# Patient Record
Sex: Female | Born: 1970
Health system: Southern US, Community
[De-identification: ages and names within clinical notes are randomized; demographics above are authoritative.]

## PROBLEM LIST (undated history)

## (undated) DIAGNOSIS — F32A Depression, unspecified: Secondary | ICD-10-CM

## (undated) DIAGNOSIS — E785 Hyperlipidemia, unspecified: Secondary | ICD-10-CM

## (undated) DIAGNOSIS — E119 Type 2 diabetes mellitus without complications: Secondary | ICD-10-CM

## (undated) DIAGNOSIS — F419 Anxiety disorder, unspecified: Secondary | ICD-10-CM

## (undated) DIAGNOSIS — K219 Gastro-esophageal reflux disease without esophagitis: Secondary | ICD-10-CM

## (undated) DIAGNOSIS — D649 Anemia, unspecified: Secondary | ICD-10-CM

## (undated) DIAGNOSIS — G2581 Restless legs syndrome: Secondary | ICD-10-CM

## (undated) DIAGNOSIS — R928 Other abnormal and inconclusive findings on diagnostic imaging of breast: Secondary | ICD-10-CM

## (undated) DIAGNOSIS — R87619 Unspecified abnormal cytological findings in specimens from cervix uteri: Secondary | ICD-10-CM

## (undated) DIAGNOSIS — Z9889 Other specified postprocedural states: Secondary | ICD-10-CM

## (undated) DIAGNOSIS — Z8719 Personal history of other diseases of the digestive system: Secondary | ICD-10-CM

## (undated) HISTORY — DX: Other abnormal and inconclusive findings on diagnostic imaging of breast: R92.8

## (undated) HISTORY — DX: Unspecified abnormal cytological findings in specimens from cervix uteri: R87.619

## (undated) HISTORY — PX: ABDOMINAL HYSTERECTOMY: SHX81

## (undated) HISTORY — DX: Hyperlipidemia, unspecified: E78.5

## (undated) HISTORY — DX: Depression, unspecified: F32.A

## (undated) HISTORY — DX: Anxiety disorder, unspecified: F41.9

## (undated) HISTORY — PX: CHOLECYSTECTOMY: SHX55

---

## 1998-11-28 HISTORY — PX: CHOLECYSTECTOMY: SHX55

## 2015-11-29 HISTORY — PX: UPPER GASTROINTESTINAL ENDOSCOPY: SHX188

## 2015-11-29 HISTORY — PX: COLONOSCOPY: SHX174

## 2017-11-28 HISTORY — PX: ABDOMINAL HYSTERECTOMY: SHX81

## 2020-03-12 DIAGNOSIS — K219 Gastro-esophageal reflux disease without esophagitis: Secondary | ICD-10-CM | POA: Insufficient documentation

## 2020-03-12 DIAGNOSIS — F419 Anxiety disorder, unspecified: Secondary | ICD-10-CM | POA: Insufficient documentation

## 2020-03-12 DIAGNOSIS — E7849 Other hyperlipidemia: Secondary | ICD-10-CM | POA: Insufficient documentation

## 2020-03-12 DIAGNOSIS — Z6838 Body mass index (BMI) 38.0-38.9, adult: Secondary | ICD-10-CM | POA: Insufficient documentation

## 2020-03-12 LAB — HM HEPATITIS C SCREENING LAB: HM Hepatitis Screen: NEGATIVE

## 2020-03-12 LAB — HM HIV SCREENING LAB: HM HIV Screening: NEGATIVE

## 2020-03-13 LAB — HM MAMMOGRAPHY

## 2021-04-22 ENCOUNTER — Ambulatory Visit
Admission: RE | Admit: 2021-04-22 | Discharge: 2021-04-22 | Disposition: A | Discharge status: Home / Self Care | Payer: Self-pay | Source: Ambulatory Visit | Attending: Nurse Practitioner | Admitting: Nurse Practitioner

## 2021-04-22 ENCOUNTER — Other Ambulatory Visit (HOSPITAL_COMMUNITY): Payer: Self-pay | Admitting: Nurse Practitioner

## 2021-04-22 ENCOUNTER — Inpatient Hospital Stay
Admission: RE | Admit: 2021-04-22 | Discharge: 2021-04-22 | Disposition: A | Discharge status: Home / Self Care | Payer: Self-pay | Source: Ambulatory Visit | Attending: Nurse Practitioner | Admitting: Nurse Practitioner

## 2021-04-22 ENCOUNTER — Ambulatory Visit (HOSPITAL_COMMUNITY)
Admission: RE | Admit: 2021-04-22 | Discharge: 2021-04-22 | Disposition: A | Discharge status: Home / Self Care | Payer: 59 | Source: Ambulatory Visit | Attending: Nurse Practitioner | Admitting: Nurse Practitioner

## 2021-04-22 ENCOUNTER — Other Ambulatory Visit: Payer: Self-pay

## 2021-04-22 DIAGNOSIS — Z1231 Encounter for screening mammogram for malignant neoplasm of breast: Secondary | ICD-10-CM

## 2021-05-06 ENCOUNTER — Ambulatory Visit (HOSPITAL_BASED_OUTPATIENT_CLINIC_OR_DEPARTMENT_OTHER): Payer: 59 | Admitting: Nurse Practitioner

## 2021-05-06 ENCOUNTER — Other Ambulatory Visit: Payer: Self-pay

## 2021-05-06 ENCOUNTER — Encounter (HOSPITAL_BASED_OUTPATIENT_CLINIC_OR_DEPARTMENT_OTHER): Payer: Self-pay | Admitting: Nurse Practitioner

## 2021-05-06 VITALS — BP 121/90 | HR 86 | Ht 65.0 in | Wt 228.8 lb

## 2021-05-06 DIAGNOSIS — F419 Anxiety disorder, unspecified: Secondary | ICD-10-CM

## 2021-05-06 DIAGNOSIS — E7849 Other hyperlipidemia: Secondary | ICD-10-CM

## 2021-05-06 DIAGNOSIS — K219 Gastro-esophageal reflux disease without esophagitis: Secondary | ICD-10-CM

## 2021-05-06 DIAGNOSIS — Z6838 Body mass index (BMI) 38.0-38.9, adult: Secondary | ICD-10-CM

## 2021-05-06 DIAGNOSIS — Z0000 Encounter for general adult medical examination without abnormal findings: Secondary | ICD-10-CM | POA: Diagnosis not present

## 2021-05-06 DIAGNOSIS — G2581 Restless legs syndrome: Secondary | ICD-10-CM | POA: Insufficient documentation

## 2021-05-06 DIAGNOSIS — F32A Depression, unspecified: Secondary | ICD-10-CM

## 2021-05-06 DIAGNOSIS — Z7689 Persons encountering health services in other specified circumstances: Secondary | ICD-10-CM | POA: Diagnosis not present

## 2021-05-06 LAB — POCT GLYCOSYLATED HEMOGLOBIN (HGB A1C): HbA1c POC (<> result, manual entry): 5.9 % (ref 4.0–5.6)

## 2021-05-06 MED ORDER — GABAPENTIN 100 MG PO CAPS: ORAL_CAPSULE | ORAL | 6 refills | Status: DC

## 2021-05-06 MED ORDER — BUPROPION HCL 100 MG PO TABS: 100.0000 mg | ORAL_TABLET | Freq: Every day | ORAL | 3 refills | Status: DC

## 2021-05-06 MED ORDER — ESOMEPRAZOLE MAGNESIUM 20 MG PO PACK: 20.0000 mg | PACK | Freq: Every day | ORAL | 3 refills | Status: DC

## 2021-05-06 MED ORDER — ATORVASTATIN CALCIUM 20 MG PO TABS: 20.0000 mg | ORAL_TABLET | Freq: Every day | ORAL | 3 refills | Status: DC

## 2021-05-06 MED ORDER — VENLAFAXINE HCL ER 150 MG PO TB24
1.0000 | ORAL_TABLET | Freq: Every day | ORAL | 3 refills | Status: DC
Start: 2021-05-06 — End: 2021-06-18

## 2021-05-06 NOTE — Patient Instructions (Addendum)
Recommendations from today's visit: I have sent your medications to the [facility] We will let you know if there are any abnormalities on your labs and if we need you to follow-up sooner than expected If you have any concerns, please let us know  Information on diet, exercise, and health maintenance recommendations are listed below. This is information to help you be sure you are on track for optimal health and monitoring.   Please look over this and let us know if you have any questions or if you have completed any of the health maintenance outside of [place] so that we can be sure your records are up to date.  ___________________________________________________________  Thank you for choosing [facility] for your Primary Care needs. I am excited for the opportunity to partner with you to meet your health care goals. It was a pleasure meeting you today!  I am an Adult-Geriatric Nurse Practitioner with a background in caring for patients for more than 20 years. I received my Paediatric nurse in Nursing and my Doctor of Nursing Practice degrees at [employer]. I received additional fellowship training in primary care and sports medicine after receiving my doctorate degree. I provide primary care and sports medicine services to patients age 41 and older within this office. I am also a provider with the [facility] and the director of the APP Fellowship with [facility].  I am a [place] native, but have called the [place] area home for nearly 20 years and am proud to be a member of this community.   I am passionate about providing the best service to you through preventive medicine and supportive care. I consider you a part of the medical team and value your input. I work diligently to ensure that you are heard and your needs are met in a safe and effective manner. I want you to feel comfortable with me as your provider  and want you to know that your health concerns are important to me.   For your information, our office hours are Monday- Friday 8:00 AM - 5:00 PM At this time I am not in the office on Wednesdays.  If you have questions or concerns, please call our office at [phone] or send [place] a MyChart message and we will respond as quickly as possible.   For all urgent or time sensitive needs we ask that you please call the office to avoid delays. MyChart is not constantly monitored and replies may take up to 72 business hours.  MyChart Policy: MyChart allows for you to see your visit notes, after visit summary, provider recommendations, lab and tests results, make an appointment, request refills, and contact your provider or the office for non-urgent questions or concerns.  Providers are seeing patients during normal business hours and do not have built in time to review MyChart messages. We ask that you allow a minimum of 72 business hours for MyChart message responses.  Complex MyChart concerns may require a visit. Your provider may request you schedule a virtual or in person visit to ensure we are providing the best care possible. MyChart messages sent after 4:00 PM on Friday will not be received by the provider until Monday morning.    Lab and Test Results: You will receive your lab and test results on MyChart as soon as they are completed and results have been sent by the lab or testing facility. Due to this service, you will  receive your results BEFORE your provider.  Please allow a minimum of 72 business hours for your provider to receive and review lab and test results and contact you about.   Most lab and test result comments from the provider will be sent through [place]. Your provider may recommend changes to the plan of care, follow-up visits, repeat testing, ask questions, or request an office visit to discuss these results. You may reply directly to this message or call the office at  [phone] to provide information for the provider or set up an appointment. In some instances, you will be called with test results and recommendations. Please let us know if this is preferred and we will make note of this in your chart to provide this for you.    If you have not heard a response to your lab or test results in 72 business hours, please call the office to let us know.   After Hours: For all non-emergency after hours needs, please call the office at [phone] and select the option to reach the on-call provider service. On-call services are shared between multiple [place] offices and therefore it will not be possible to speak directly with your provider. On-call providers may provide medical advice and recommendations, but are unable to provide refills for maintenance medications.  For all emergency or urgent medical needs after normal business hours, we recommend that you seek care at the closest Urgent Care or Emergency Department to ensure appropriate treatment in a timely manner.  [facility] [place] has a 24 hour emergency room located on the ground floor for your convenience.    Please do not hesitate to reach out to [place] with concerns.   Thank you, again, for choosing me as your health care partner. I appreciate your trust and look forward to learning more about you.   Worthy Keeler, DNP, AGNP-c ___________________________________________________________  Health Maintenance Recommendations Screening Testing Mammogram Every 1 -2 years based on history and risk factors Starting at age 58 Pap Smear Ages 21-39 every 3 years Ages 37-65 every 5 years with HPV testing More frequent testing may be required based on results and history Colon Cancer Screening Every 1-10 years based on test performed, risk factors, and history Starting at age 61 Bone Density Screening Every 2-10 years based on history Starting at age 32 for women Recommendations for  men differ based on medication usage, history, and risk factors AAA Screening One time ultrasound Men 37-77 years old who have every smoked Lung Cancer Screening Low Dose Lung CT every 12 months Age 3-80 years with a 30 pack-year smoking history who still smoke or who have quit within the last 15 years  Screening Labs Routine  Labs: Complete Blood Count (CBC), Complete Metabolic Panel (CMP), Cholesterol (Lipid Panel) Every 6-12 months based on history and medications May be recommended more frequently based on current conditions or previous results Hemoglobin A1c Lab Every 3-12 months based on history and previous results Starting at age 68 or earlier with diagnosis of diabetes, high cholesterol, BMI >26, and/or risk factors Frequent monitoring for patients with diabetes to ensure blood sugar control Thyroid Panel (TSH w/ T3 & T4) Every 6 months based on history, symptoms, and risk factors May be repeated more often if on medication HIV One time testing for all patients 57 and older May be repeated more frequently for patients with increased risk factors or exposure Hepatitis C One time testing for all patients 55 and older May be repeated more  frequently for patients with increased risk factors or exposure Gonorrhea, Chlamydia Every 12 months for all sexually active persons 13-24 years Additional monitoring may be recommended for those who are considered high risk or who have symptoms PSA Men 60-54 years old with risk factors Additional screening may be recommended from age 36-69 based on risk factors, symptoms, and history  Vaccine Recommendations Tetanus Booster All adults every 10 years Flu Vaccine All patients 6 months and older every year COVID Vaccine All patients 12 years and older Initial dosing with booster May recommend additional booster based on age and health history HPV Vaccine 2 doses all patients age 32-26 Dosing may be considered for patients over  26 Shingles Vaccine (Shingrix) 2 doses all adults 35 years and older Pneumonia (Pneumovax 23) All adults 35 years and older May recommend earlier dosing based on health history Pneumonia (Prevnar 39) All adults 34 years and older Dosed 1 year after Pneumovax 23  Additional Screening, Testing, and Vaccinations may be recommended on an individualized basis based on family history, health history, risk factors, and/or exposure.  __________________________________________________________  Diet Recommendations for All Patients  I recommend that all patients maintain a diet low in saturated fats, carbohydrates, and cholesterol. While this can be challenging at first, it is not impossible and small changes can make big differences.  Things to try: Decreasing the amount of soda, sweet tea, and/or juice to one or less per day and replace with water While water is always the first choice, if you do not like water you may consider adding a water additive without sugar to improve the taste other sugar free drinks Replace potatoes with a brightly colored vegetable at dinner Use healthy oils, such as canola oil or olive oil, instead of butter or hard margarine Limit your bread intake to two pieces or less a day Replace regular pasta with low carb pasta options Bake, broil, or grill foods instead of frying Monitor portion sizes  Eat smaller, more frequent meals throughout the day instead of large meals  An important thing to remember is, if you love foods that are not great for your health, you don't have to give them up completely. Instead, allow these foods to be a reward when you have done well. Allowing yourself to still have special treats every once in a while is a nice way to tell yourself thank you for working hard to keep yourself healthy.   Also remember that every day is a new day. If you have a bad day and "fall off the wagon", you can still climb right back up and keep moving along on  your journey!  We have resources available to help you!  Some websites that may be helpful include: www.http://carter.biz/  Www.VeryWellFit.com _____________________________________________________________  Activity Recommendations for All Patients  I recommend that all adults get at least 20 minutes of moderate physical activity that elevates your heart rate at least 5 days out of the week.  Some examples include: Walking or jogging at a pace that allows you to carry on a conversation Cycling (stationary bike or outdoors) Water aerobics Yoga Weight lifting Dancing If physical limitations prevent you from putting stress on your joints, exercise in a pool or seated in a chair are excellent options.  Do determine your MAXIMUM heart rate for activity: YOUR AGE - 220 = MAX HeartRate   Remember! Do not push yourself too hard.  Start slowly and build up your pace, speed, weight, time in exercise, etc.  Allow your  body to rest between exercise and get good sleep. You will need more water than normal when you are exerting yourself. Do not wait until you are thirsty to drink. Drink with a purpose of getting in at least 8, 8 ounce glasses of water a day plus more depending on how much you exercise and sweat.    If you begin to develop dizziness, chest pain, abdominal pain, jaw pain, shortness of breath, headache, vision changes, lightheadedness, or other concerning symptoms, stop the activity and allow your body to rest. If your symptoms are severe, seek emergency evaluation immediately. If your symptoms are concerning, but not severe, please let us know so that we can recommend further evaluation.   ________________________________________________________________

## 2021-05-06 NOTE — Progress Notes (Signed)
Worthy Keeler, DNP, AGNP-c Primary Care Services ______________________________________________________________________________________________________________________________________________  HPI Anne Kramer is a 50 y.o. female presenting to [facility] today to establish care.   Patient Care Team: Jarquis Walker, Coralee Pesa, NP as PCP - General (Nurse Practitioner)   Concerns today: Difficulty Sleeping/Staying Asleep Endorses difficulty falling asleep and staying asleep Legs restless and keep her awake Has tried many OTC and Rx medications for sleep in the past that have not been effective Good bedtime routine, no late night meals, no distractions, no late night caffeine intake Generalized symptoms: Night sweats, fatigue, joint pain, vision changes, easy bruising, increased thirst, weight gain She has been monitoring her diet well and exercising but has had 30lb weight gain in last couple of years Eats small portions at meals No N/V/D Hysterectomy with cervix removal- likely post-menopausal   PHQ9 Today: Depression screen PHQ 2/9 05/06/2021  Decreased Interest 1  Down, Depressed, Hopeless 2  PHQ - 2 Score 3  Altered sleeping 3  Tired, decreased energy 3  Change in appetite 3  Feeling bad or failure about yourself  2  Trouble concentrating 1  Moving slowly or fidgety/restless 0  Suicidal thoughts 1  PHQ-9 Score 16  Difficult doing work/chores Somewhat difficult   GAD7 Today: GAD 7 : Generalized Anxiety Score 05/06/2021  Nervous, Anxious, on Edge 2  Control/stop worrying 2  Worry too much - different things 1  Trouble relaxing 1  Restless 1  Easily annoyed or irritable 1  Afraid - awful might happen 0  Total GAD 7 Score 8  Anxiety Difficulty Somewhat difficult    Health Maintenance Due  Topic Date Due   HIV Screening  Never done   Hepatitis C Screening  Never done   PAP SMEAR-Modifier  Never done   COLONOSCOPY  (Pts 45-79yrs Insurance coverage will need to be confirmed)  Never done   Zoster Vaccines- Shingrix (1 of 2) Never done   COVID-19 Vaccine (3 - Booster for Pfizer series) 12/02/2020     PMH Past Medical History:  Diagnosis Date   Abnormal mammogram    Abnormal Pap smear of cervix    Anxiety    Depression    Hyperlipidemia     ROS All review of systems negative except what is listed in the HPI  PHYSICAL EXAM Physical Exam Vitals and nursing note reviewed.  Constitutional:      Appearance: Normal appearance. She is obese.  HENT:     Head: Normocephalic.     Right Ear: Tympanic membrane, ear canal and external ear normal.     Left Ear: Tympanic membrane, ear canal and external ear normal.     Nose: Nose normal.     Mouth/Throat:     Mouth: Mucous membranes are moist.     Pharynx: Oropharynx is clear.  Eyes:     Extraocular Movements: Extraocular movements intact.     Conjunctiva/sclera: Conjunctivae normal.     Pupils: Pupils are equal, round, and reactive to light.  Neck:     Comments: Thyromegaly present Cardiovascular:     Rate and Rhythm: Normal rate and regular rhythm.     Pulses: Normal pulses.     Heart sounds: Normal heart sounds.  Pulmonary:     Effort: Pulmonary effort is normal.     Breath sounds: Normal breath sounds.  Abdominal:     General: Abdomen is flat. Bowel sounds are normal. There is no distension.     Palpations: Abdomen  is soft.     Tenderness: There is no abdominal tenderness. There is no right CVA tenderness, left CVA tenderness, guarding or rebound.  Musculoskeletal:        General: Normal range of motion.     Cervical back: Normal range of motion. No tenderness.     Right lower leg: No edema.     Left lower leg: No edema.  Lymphadenopathy:     Cervical: No cervical adenopathy.  Skin:    General: Skin is warm and dry.     Capillary Refill: Capillary refill takes less than 2 seconds.  Neurological:     General: No focal deficit  present.     Mental Status: She is alert and oriented to person, place, and time.  Psychiatric:        Mood and Affect: Mood normal.        Behavior: Behavior normal.        Thought Content: Thought content normal.        Judgment: Judgment normal.      ASSESSMENT AND PLAN Problem List Items Addressed This Visit     Anxiety and depression - Primary   Relevant Medications   buPROPion (WELLBUTRIN) 100 MG tablet   Venlafaxine HCl 150 MG TB24   Other Relevant Orders   TSH   BMI 38.0-38.9,adult   Relevant Medications   atorvastatin (LIPITOR) 20 MG tablet   Other Relevant Orders   Comprehensive metabolic panel   Lipid panel   TSH   POCT glycosylated hemoglobin (Hb A1C) (Completed)   CBC with Differential   Gastroesophageal reflux disease without esophagitis   Relevant Medications   esomeprazole (NEXIUM) 20 MG packet   Other Relevant Orders   CBC with Differential   Other hyperlipidemia   Relevant Medications   atorvastatin (LIPITOR) 20 MG tablet   Other Relevant Orders   Comprehensive metabolic panel   Lipid panel   POCT glycosylated hemoglobin (Hb A1C) (Completed)   Restless leg syndrome   Relevant Medications   gabapentin (NEURONTIN) 100 MG capsule   Other Relevant Orders   TSH   Other Visit Diagnoses     Encounter to establish care       Encounter for physical examination       Laboratory tests ordered as part of a complete physical exam (CPE)       Relevant Orders   Comprehensive metabolic panel   Lipid panel   TSH   POCT glycosylated hemoglobin (Hb A1C) (Completed)   CBC with Differential      PLAN: Labs today for evaluation of diabetes and thyroid disorder given some of her symptoms present.  Nighttime symptoms appear to be due to restless leg syndrome. Will try gabapentin 100-300mg  at bedtime to see if this is helpful in reducing symptoms and permitting restful sleep. Recommend F/U if this is not effective No alarm symptoms of PE today.   Education  provided today during visit and on AVS for patient to review at home.  Diet and Exercise recommendations provided.  Current diagnoses and recommendations discussed. HM recommendations reviewed with recommendations.    Outpatient Encounter Medications as of 05/06/2021  Medication Sig   gabapentin (NEURONTIN) 100 MG capsule Take 1 capsule (100mg ) twice during the day, 8 hours apart as needed. Take 1-3 capsules (100mg  - 300mg ) at night for restless legs and anxiety   [DISCONTINUED] atorvastatin (LIPITOR) 20 MG tablet TAKE 1 TABLET(20 MG) BY MOUTH DAILY   [DISCONTINUED] atorvastatin (LIPITOR) 20 MG tablet Take 20  mg by mouth daily.   [DISCONTINUED] buPROPion (WELLBUTRIN) 100 MG tablet Take by mouth.   [DISCONTINUED] cyclobenzaprine (FLEXERIL) 10 MG tablet Take 1 tablet by mouth 3 (three) times daily.   [DISCONTINUED] esomeprazole (NEXIUM) 20 MG packet Take by mouth.   [DISCONTINUED] predniSONE (STERAPRED UNI-PAK 48 TAB) 5 MG (48) TBPK tablet Take by mouth as directed.   [DISCONTINUED] Venlafaxine HCl 150 MG TB24 Take 1 tablet by mouth daily.   atorvastatin (LIPITOR) 20 MG tablet Take 1 tablet (20 mg total) by mouth daily.   buPROPion (WELLBUTRIN) 100 MG tablet Take 1 tablet (100 mg total) by mouth daily.   esomeprazole (NEXIUM) 20 MG packet Take 20 mg by mouth daily before breakfast.   Venlafaxine HCl 150 MG TB24 Take 1 tablet (150 mg total) by mouth daily.   No facility-administered encounter medications on file as of 05/06/2021.    Return in about 1 year (around 05/06/2022) for CPE.  Time: 57minutes, >50% spent counseling, care coordination, chart review, and documentation.   Orma Render, DNP, AGNP-c

## 2021-05-07 LAB — CBC WITH DIFFERENTIAL/PLATELET
Basophils Absolute: 0.1 10*3/uL (ref 0.0–0.2)
Basos: 1 %
EOS (ABSOLUTE): 0.3 10*3/uL (ref 0.0–0.4)
Eos: 4 %
Hematocrit: 41.3 % (ref 34.0–46.6)
Hemoglobin: 13.8 g/dL (ref 11.1–15.9)
Immature Grans (Abs): 0 10*3/uL (ref 0.0–0.1)
Immature Granulocytes: 0 %
Lymphocytes Absolute: 1.8 10*3/uL (ref 0.7–3.1)
Lymphs: 28 %
MCH: 28.6 pg (ref 26.6–33.0)
MCHC: 33.4 g/dL (ref 31.5–35.7)
MCV: 86 fL (ref 79–97)
Monocytes Absolute: 0.4 10*3/uL (ref 0.1–0.9)
Monocytes: 6 %
Neutrophils Absolute: 4 10*3/uL (ref 1.4–7.0)
Neutrophils: 61 %
Platelets: 351 10*3/uL (ref 150–450)
RBC: 4.83 x10E6/uL (ref 3.77–5.28)
RDW: 13 % (ref 11.7–15.4)
WBC: 6.6 10*3/uL (ref 3.4–10.8)

## 2021-05-07 LAB — COMPREHENSIVE METABOLIC PANEL
ALT: 27 IU/L (ref 0–32)
AST: 22 IU/L (ref 0–40)
Albumin/Globulin Ratio: 1.7 (ref 1.2–2.2)
Albumin: 4.7 g/dL (ref 3.8–4.8)
Alkaline Phosphatase: 139 IU/L — ABNORMAL HIGH (ref 44–121)
BUN/Creatinine Ratio: 13 (ref 9–23)
BUN: 13 mg/dL (ref 6–24)
Bilirubin Total: 0.6 mg/dL (ref 0.0–1.2)
CO2: 20 mmol/L (ref 20–29)
Calcium: 10.3 mg/dL — ABNORMAL HIGH (ref 8.7–10.2)
Chloride: 103 mmol/L (ref 96–106)
Creatinine, Ser: 0.97 mg/dL (ref 0.57–1.00)
Globulin, Total: 2.8 g/dL (ref 1.5–4.5)
Glucose: 98 mg/dL (ref 65–99)
Potassium: 4.4 mmol/L (ref 3.5–5.2)
Sodium: 138 mmol/L (ref 134–144)
Total Protein: 7.5 g/dL (ref 6.0–8.5)
eGFR: 71 mL/min/{1.73_m2} (ref >59)

## 2021-05-07 LAB — LIPID PANEL
Chol/HDL Ratio: 5 ratio — ABNORMAL HIGH (ref 0.0–4.4)
Cholesterol, Total: 280 mg/dL — ABNORMAL HIGH (ref 100–199)
HDL: 56 mg/dL (ref >39)
LDL Chol Calc (NIH): 203 mg/dL — ABNORMAL HIGH (ref 0–99)
Triglycerides: 118 mg/dL (ref 0–149)
VLDL Cholesterol Cal: 21 mg/dL (ref 5–40)

## 2021-05-07 LAB — TSH: TSH: 0.608 u[IU]/mL (ref 0.450–4.500)

## 2021-05-10 NOTE — Progress Notes (Signed)
Please call patient.  A1c indicates pre-diabetes. Recommend start on metformin- may also consider possible injectable medication to help with blood sugar and weight loss depending on insurance. If interested, please schedule f/u visit to discuss. May also work on diet and exercise to help reduce A1c and f/u in 6 months for recheck.   Cholesterol is elevated. Work on diet and exercise and we will plan to recheck in 6 months to determine if we need to make changes.  Recommend foods low in saturated fats- avoid oils/fats that are solid at room temperature and exchange for vegetable oils or Olive oil.  Recommend walking 20 minutes every day.   All other labs normal.

## 2021-05-12 ENCOUNTER — Telehealth (HOSPITAL_BASED_OUTPATIENT_CLINIC_OR_DEPARTMENT_OTHER): Payer: Self-pay

## 2021-05-12 NOTE — Telephone Encounter (Signed)
Left message for patient to call back for results and recommendations. 

## 2021-05-12 NOTE — Telephone Encounter (Signed)
Patient called to discuss lab result and recommendations.  She is aware and agrees.  Appointments for follow up scheduled on 05/25/21 @ 8:50a am and 11/11/21 @ 8:50.  Instructed patient to contact the office with questions and concerns

## 2021-05-12 NOTE — Telephone Encounter (Signed)
-----   Message from Orma Render, NP sent at 05/10/2021  8:31 AM EDT ----- Please call patient.  A1c indicates pre-diabetes. Recommend start on metformin- may also consider possible injectable medication to help with blood sugar and weight loss depending on insurance. If interested, please schedule f/u visit to discuss. May also work on diet and exercise to help reduce A1c and f/u in 6 months for recheck.   Cholesterol is elevated. Work on diet and exercise and we will plan to recheck in 6 months to determine if we need to make changes.  Recommend foods low in saturated fats- avoid oils/fats that are solid at room temperature and exchange for vegetable oils or Olive oil.  Recommend walking 20 minutes every day.   All other labs normal.

## 2021-05-19 ENCOUNTER — Encounter (HOSPITAL_BASED_OUTPATIENT_CLINIC_OR_DEPARTMENT_OTHER): Payer: Self-pay | Admitting: Nurse Practitioner

## 2021-05-25 ENCOUNTER — Ambulatory Visit (HOSPITAL_BASED_OUTPATIENT_CLINIC_OR_DEPARTMENT_OTHER): Payer: 59 | Admitting: Nurse Practitioner

## 2021-05-25 ENCOUNTER — Other Ambulatory Visit: Payer: Self-pay

## 2021-05-25 ENCOUNTER — Encounter (HOSPITAL_BASED_OUTPATIENT_CLINIC_OR_DEPARTMENT_OTHER): Payer: Self-pay | Admitting: Nurse Practitioner

## 2021-05-25 VITALS — BP 127/86 | HR 89 | Ht 65.0 in | Wt 227.8 lb

## 2021-05-25 DIAGNOSIS — Z6838 Body mass index (BMI) 38.0-38.9, adult: Secondary | ICD-10-CM | POA: Diagnosis not present

## 2021-05-25 DIAGNOSIS — R7303 Prediabetes: Secondary | ICD-10-CM | POA: Diagnosis not present

## 2021-05-25 DIAGNOSIS — E7849 Other hyperlipidemia: Secondary | ICD-10-CM | POA: Diagnosis not present

## 2021-05-25 MED ORDER — METFORMIN HCL 500 MG PO TABS: 500.0000 mg | ORAL_TABLET | Freq: Two times a day (BID) | ORAL | 3 refills | Status: DC

## 2021-05-25 MED ORDER — PHENTERMINE HCL 37.5 MG PO CAPS: 37.5000 mg | ORAL_CAPSULE | ORAL | 0 refills | Status: DC

## 2021-05-25 MED ORDER — PHENTERMINE HCL 37.5 MG PO CAPS: ORAL_CAPSULE | ORAL | 0 refills | Status: DC

## 2021-05-25 MED ORDER — BLOOD GLUCOSE METER KIT: PACK | 0 refills | Status: DC

## 2021-05-25 NOTE — Assessment & Plan Note (Signed)
Most recent lipid levels elevated. Taking artorvastatin 20mg  daily Strong feeling familial HLD present in addition to diet  Recommend strict dietary monitoring and decrease saturated fats and carbohydrates. Increase fiber and water intake Walking minimum 15 min per day - purposeful Will recheck in 6 months- if no improvement, plan to increase statin dosage

## 2021-05-25 NOTE — Assessment & Plan Note (Signed)
A1c 5.9% In the setting of obesity and HLD, want to get tight control of this Anne Kramer before additional problems develop.  BP at goal Lipids not at goal- atorvastatin 20mg  and strict diet and exercise guidelines discussed Recommend starting metformin 500mg  BID with meals Increase dietary fiber and water intake Caloric intake 1200-1500 calories per day with no more than 200g of carbohydrates. Monitor blood sugar fasting at least three days a week- Goal 70-99 Monitor post-prandial blood sugar (1-2 hours after meal) to determine effects of foods - Goal less than 135 (for weight loss benefit) Walk at least 57min per day every day and increase each week.  F/U in 6 months for labs

## 2021-05-25 NOTE — Patient Instructions (Addendum)
PLAN:  Diet changes:  Increase fiber Increase water Reduce saturated fats and fried foods No more than 1500 calories per day No more than 150-200 grams of carbohydrates per day  Exercise: Walk 15 minutes every day  Monitoring: Check your blood sugar at least 3 mornings per week when you wake up (fasting) Check your blood sugar as needed 1-2 hours after meals to see how the foods affected your blood sugar  Medications: Metformin 500mg  in the morning with breakfast and 500mg  in the evening with dinner.  Vitamin D3 800iU per day Continue Lipitor at the same dose  Recheck Plan to come back in 6 months for labs

## 2021-05-25 NOTE — Progress Notes (Signed)
Established Patient Office Visit  Subjective:  Patient ID: Anne Kramer, female    DOB: 1971-08-15  Age: 50 y.o. MRN: [id number]  CC:  Chief Complaint  Patient presents with   Follow-up    HPI Anne Kramer presents for follow-up for new diagnosis of pre-diabetes.  Most recent Hgb A1c was 5.9%. She also has a history of HLD- most likely familial given significant prevalence in all 1st degree relatives despite lack of co-morbidities or lifestyle factors in some. Has a history of elevated BMI. History of Vitamin D deficiency.   She has not been on any medications for diabetes in the past. Currently on atorvastatin 53m per day. No HTN Diet is "not where it should be"  No routine exercise at the current time, but had been working out in the past.   Endorses weight gain with difficulty losing weight, increased thirst, fatigue, constipation, and joint pain. Denies chest pain, palpitations, slow healing wounds, numbness or tingling in extremities, changes in vision.   Past Medical History:  Diagnosis Date   Abnormal mammogram    Abnormal Pap smear of cervix    Anxiety    Depression    Hyperlipidemia     Past Surgical History:  Procedure Laterality Date   ABDOMINAL HYSTERECTOMY     CHOLECYSTECTOMY      Family History  Problem Relation Age of Onset   Breast cancer Maternal Aunt     Social History   Socioeconomic History   Marital status: Married    Spouse name: Not on file   Number of children: Not on file   Years of education: Not on file   Highest education level: Not on file  Occupational History   Not on file  Tobacco Use   Smoking status: Never   Smokeless tobacco: Never  Vaping Use   Vaping Use: Never used  Substance and Sexual Activity   Alcohol use: Not Currently   Drug use: Never   Sexual activity: Yes    Birth control/protection: Surgical  Other Topics Concern   Not on file  Social History Narrative   Not on file   Social  Determinants of Health   Financial Resource Strain: Not on file  Food Insecurity: Not on file  Transportation Needs: Not on file  Physical Activity: Not on file  Stress: Not on file  Social Connections: Not on file  Intimate Partner Violence: Not on file    Outpatient Medications Prior to Visit  Medication Sig Dispense Refill   atorvastatin (LIPITOR) 20 MG tablet Take 1 tablet (20 mg total) by mouth daily. 90 tablet 3   buPROPion (WELLBUTRIN) 100 MG tablet Take 1 tablet (100 mg total) by mouth daily. 90 tablet 3   esomeprazole (NEXIUM) 20 MG packet Take 20 mg by mouth daily before breakfast. 90 each 3   gabapentin (NEURONTIN) 100 MG capsule Take 1 capsule (1067m twice during the day, 8 hours apart as needed. Take 1-3 capsules (10050m 300m76mt night for restless legs and anxiety 150 capsule 6   Venlafaxine HCl 150 MG TB24 Take 1 tablet (150 mg total) by mouth daily. 90 tablet 3   No facility-administered medications prior to visit.    Not on File  ROS Review of Systems All review of systems negative except what is listed in the HPI   Objective:    Physical Exam Vitals and nursing note reviewed.  Constitutional:      Appearance: Normal appearance. She is obese.  HENT:  Head: Normocephalic.  Eyes:     Extraocular Movements: Extraocular movements intact.     Conjunctiva/sclera: Conjunctivae normal.     Pupils: Pupils are equal, round, and reactive to light.  Neck:     Vascular: No carotid bruit.  Cardiovascular:     Rate and Rhythm: Normal rate and regular rhythm.     Pulses: Normal pulses.     Heart sounds: Normal heart sounds.  Pulmonary:     Effort: Pulmonary effort is normal.     Breath sounds: Normal breath sounds.  Abdominal:     General: Abdomen is flat. Bowel sounds are normal.     Palpations: Abdomen is soft.  Musculoskeletal:        General: Normal range of motion.     Cervical back: Normal range of motion and neck supple.     Right lower leg: No  edema.     Left lower leg: No edema.  Skin:    General: Skin is warm and dry.     Capillary Refill: Capillary refill takes less than 2 seconds.  Neurological:     General: No focal deficit present.     Mental Status: She is alert and oriented to person, place, and time.  Psychiatric:        Mood and Affect: Mood normal.        Behavior: Behavior normal.        Thought Content: Thought content normal.        Judgment: Judgment normal.    BP 127/86   Pulse 89   Ht 5' 5"  (1.651 m)   Wt 227 lb 12.8 oz (103.3 kg)   SpO2 100%   BMI 37.91 kg/m  Wt Readings from Last 3 Encounters:  05/25/21 227 lb 12.8 oz (103.3 kg)  05/06/21 228 lb 12.8 oz (103.8 kg)     Health Maintenance Due  Topic Date Due   PAP SMEAR-Modifier  Never done   COLONOSCOPY (Pts 45-30yr Insurance coverage will need to be confirmed)  Never done   Zoster Vaccines- Shingrix (1 of 2) Never done   COVID-19 Vaccine (3 - Booster for Pfizer series) 12/02/2020    There are no preventive care reminders to display for this patient.  Lab Results  Component Value Date   TSH 0.608 05/06/2021   Lab Results  Component Value Date   WBC 6.6 05/06/2021   HGB 13.8 05/06/2021   HCT 41.3 05/06/2021   MCV 86 05/06/2021   PLT 351 05/06/2021   Lab Results  Component Value Date   NA 138 05/06/2021   K 4.4 05/06/2021   CO2 20 05/06/2021   GLUCOSE 98 05/06/2021   BUN 13 05/06/2021   CREATININE 0.97 05/06/2021   BILITOT 0.6 05/06/2021   ALKPHOS 139 (H) 05/06/2021   AST 22 05/06/2021   ALT 27 05/06/2021   PROT 7.5 05/06/2021   ALBUMIN 4.7 05/06/2021   CALCIUM 10.3 (H) 05/06/2021   EGFR 71 05/06/2021   Lab Results  Component Value Date   CHOL 280 (H) 05/06/2021   Lab Results  Component Value Date   HDL 56 05/06/2021   Lab Results  Component Value Date   LDLCALC 203 (H) 05/06/2021   Lab Results  Component Value Date   TRIG 118 05/06/2021   Lab Results  Component Value Date   CHOLHDL 5.0 (H) 05/06/2021    Lab Results  Component Value Date   HGBA1C 5.9 05/06/2021      Assessment & Plan:  Problem List Items Addressed This Visit     Pre-diabetes - Primary   Relevant Medications   metFORMIN (GLUCOPHAGE) 500 MG tablet   blood glucose meter kit and supplies   BMI 38.0-38.9,adult   Relevant Medications   metFORMIN (GLUCOPHAGE) 500 MG tablet   blood glucose meter kit and supplies   phentermine 37.5 MG capsule (Start on 07/23/2021)   phentermine 37.5 MG capsule   phentermine 37.5 MG capsule   Other hyperlipidemia   Relevant Medications   metFORMIN (GLUCOPHAGE) 500 MG tablet   blood glucose meter kit and supplies    Meds ordered this encounter  Medications   metFORMIN (GLUCOPHAGE) 500 MG tablet    Sig: Take 1 tablet (500 mg total) by mouth 2 (two) times daily with a meal.    Dispense:  180 tablet    Refill:  3   blood glucose meter kit and supplies    Sig: Dispense based on patient and insurance preference. Use up to four times daily as directed. (FOR ICD-10 R73.03, Z68.38)    Dispense:  1 each    Refill:  0    Order Specific Question:   Number of strips    Answer:   100    Order Specific Question:   Number of lancets    Answer:   100   phentermine 37.5 MG capsule    Sig: Take 1 capsule (37.5 mg total) by mouth every morning. Script 3 of 3    Dispense:  30 capsule    Refill:  0   phentermine 37.5 MG capsule    Sig: One capsule by mouth qAM. Script 2 of 3    Dispense:  30 capsule    Refill:  0   phentermine 37.5 MG capsule    Sig: One capsule by mouth qAM- script 1 of 3    Dispense:  30 capsule    Refill:  0    Follow-up: Return in about 6 months (around 11/24/2021) for 3 months virtual visit- weight and 6 months in person DM/HLD.    Orma Render, NP

## 2021-05-25 NOTE — Assessment & Plan Note (Signed)
BMI 37.91 today Recommend 1200-1500 calorie diet with no more than 50% of calories from carbohydrates. (150-200 grams carbohydrates per day) Goal post-prandial (1-2) hour blood sugars no more than 135 for weight loss.  Decrease saturated fats and carbohydrates, increase protein and fiber Increase water intake Minimum 15 minutes walking per day- purposeful. Starting phentermine 37.5mg  for three months to boost metabolism. F/U with VV in 3 months for weight management.

## 2021-06-01 ENCOUNTER — Encounter (HOSPITAL_BASED_OUTPATIENT_CLINIC_OR_DEPARTMENT_OTHER): Payer: Self-pay | Admitting: Nurse Practitioner

## 2021-06-09 ENCOUNTER — Other Ambulatory Visit (HOSPITAL_COMMUNITY): Payer: Self-pay

## 2021-06-09 MED ORDER — ESOMEPRAZOLE MAGNESIUM 20 MG PO PACK
20.0000 mg | PACK | ORAL | 3 refills | Status: DC
  Filled 2021-06-09: qty 90, 90d supply, fill #0

## 2021-06-09 MED ORDER — GABAPENTIN 100 MG PO CAPS
100.0000 mg | ORAL_CAPSULE | Freq: Two times a day (BID) | ORAL | 5 refills | Status: DC
Start: 2021-05-06 — End: 2022-05-12
  Filled 2021-06-09: qty 150, 30d supply, fill #0
  Filled 2021-09-02: qty 150, 30d supply, fill #1
  Filled 2021-11-02: qty 150, 30d supply, fill #2
  Filled 2022-01-12: qty 150, 30d supply, fill #3
  Filled 2022-02-23: qty 150, 30d supply, fill #4

## 2021-06-09 MED ORDER — ATORVASTATIN CALCIUM 20 MG PO TABS
20.0000 mg | ORAL_TABLET | Freq: Every day | ORAL | 0 refills | Status: DC
  Filled 2021-06-09: qty 90, 90d supply, fill #0
  Filled 2021-09-13: qty 90, 90d supply, fill #1

## 2021-06-09 MED ORDER — VENLAFAXINE HCL ER 150 MG PO TB24
150.0000 mg | ORAL_TABLET | Freq: Every day | ORAL | 3 refills | Status: DC
  Filled 2021-06-09: qty 90, 90d supply, fill #0

## 2021-06-09 MED ORDER — BUPROPION HCL 100 MG PO TABS
100.0000 mg | ORAL_TABLET | Freq: Every day | ORAL | 0 refills | Status: DC
  Filled 2021-06-09: qty 90, 90d supply, fill #0
  Filled 2022-02-23: qty 90, 90d supply, fill #1

## 2021-06-11 ENCOUNTER — Other Ambulatory Visit (HOSPITAL_COMMUNITY): Payer: Self-pay

## 2021-06-14 ENCOUNTER — Other Ambulatory Visit (HOSPITAL_COMMUNITY): Payer: Self-pay

## 2021-06-15 ENCOUNTER — Other Ambulatory Visit (HOSPITAL_BASED_OUTPATIENT_CLINIC_OR_DEPARTMENT_OTHER): Payer: Self-pay | Admitting: Nurse Practitioner

## 2021-06-15 ENCOUNTER — Other Ambulatory Visit (HOSPITAL_COMMUNITY): Payer: Self-pay

## 2021-06-16 ENCOUNTER — Other Ambulatory Visit (HOSPITAL_COMMUNITY): Payer: Self-pay

## 2021-06-17 ENCOUNTER — Encounter (HOSPITAL_BASED_OUTPATIENT_CLINIC_OR_DEPARTMENT_OTHER): Payer: Self-pay | Admitting: Nurse Practitioner

## 2021-06-18 ENCOUNTER — Other Ambulatory Visit (HOSPITAL_COMMUNITY): Payer: Self-pay

## 2021-06-18 ENCOUNTER — Other Ambulatory Visit (HOSPITAL_BASED_OUTPATIENT_CLINIC_OR_DEPARTMENT_OTHER): Payer: Self-pay | Admitting: Nurse Practitioner

## 2021-06-18 MED ORDER — ESOMEPRAZOLE MAGNESIUM 20 MG PO CPDR
20.0000 mg | DELAYED_RELEASE_CAPSULE | Freq: Every day | ORAL | 3 refills | Status: DC
  Filled 2021-06-18: qty 90, 90d supply, fill #0
  Filled 2021-08-10: qty 90, 90d supply, fill #1

## 2021-06-18 MED ORDER — VENLAFAXINE HCL ER 150 MG PO CP24
150.0000 mg | ORAL_CAPSULE | Freq: Every day | ORAL | 3 refills | Status: DC
  Filled 2021-06-18: qty 90, 90d supply, fill #0
  Filled 2021-09-19: qty 90, 90d supply, fill #1
  Filled 2021-12-21: qty 90, 90d supply, fill #2
  Filled 2022-03-24: qty 90, 90d supply, fill #3

## 2021-08-03 ENCOUNTER — Other Ambulatory Visit: Payer: Self-pay

## 2021-08-03 ENCOUNTER — Encounter: Payer: Self-pay | Admitting: Orthopaedic Surgery

## 2021-08-03 ENCOUNTER — Ambulatory Visit: Payer: 59 | Admitting: Orthopaedic Surgery

## 2021-08-03 ENCOUNTER — Ambulatory Visit
Admission: EM | Admit: 2021-08-03 | Discharge: 2021-08-03 | Disposition: A | Discharge status: Home / Self Care | Payer: 59 | Attending: Family Medicine | Admitting: Family Medicine

## 2021-08-03 ENCOUNTER — Encounter: Payer: Self-pay | Admitting: Emergency Medicine

## 2021-08-03 VITALS — BP 136/92 | HR 99 | Ht 65.0 in | Wt 220.0 lb

## 2021-08-03 DIAGNOSIS — M7711 Lateral epicondylitis, right elbow: Secondary | ICD-10-CM

## 2021-08-03 DIAGNOSIS — J029 Acute pharyngitis, unspecified: Secondary | ICD-10-CM | POA: Diagnosis not present

## 2021-08-03 HISTORY — DX: Type 2 diabetes mellitus without complications: E11.9

## 2021-08-03 NOTE — ED Provider Notes (Signed)
  [place]   [id number] 08/03/21 Arrival Time: [id number]  ASSESSMENT & PLAN:  1. Sore throat    Discussed typical duration of viral illnesses. OTC symptom care as needed.    Discharge Instructions      You may use over the counter ibuprofen or acetaminophen as needed.  For a sore throat, over the counter products such as [employer] Peroxyl Mouth Sore Rinse or Chloraseptic Sore Throat Spray may provide some temporary relief.      Follow-up Information     Early, Coralee Pesa, NP.   Specialty: Nurse Practitioner Why: As needed. Contact information: [address] [place] [address] [phone]                 Reviewed expectations re: course of current medical issues. Questions answered. Outlined signs and symptoms indicating need for more acute intervention. Understanding verbalized. After Visit Summary given.   SUBJECTIVE: History from: patient. Anne Kramer is a 50 y.o. female who reports ST; x 5 days; neg COVID and rapid strep 5 d ago. Denies: fever, cough, and difficulty breathing. Normal PO intake without n/v/d.   OBJECTIVE:  Vitals:   08/03/21 0949  BP: 133/86  Pulse: 86  Resp: 18  Temp: 99.3 F (37.4 C)  TempSrc: Oral  SpO2: 95%    General appearance: alert; no distress Eyes: PERRLA; EOMI; conjunctiva normal HENT: [place]; AT; without nasal congestion; throat raw and irritated; no exudates Neck: supple s LAD Lungs: speaks full sentences without difficulty; unlabored Extremities: no edema Skin: warm and dry Neurologic: normal gait Psychological: alert and cooperative; normal mood and affect   No Known Allergies  Past Medical History:  Diagnosis Date   Abnormal mammogram    Abnormal Pap smear of cervix    Anxiety    Depression    Diabetes mellitus without complication (HCC)    Hyperlipidemia    Social History   Socioeconomic History   Marital status: Married    Spouse name: Not on file   Number of  children: Not on file   Years of education: Not on file   Highest education level: Not on file  Occupational History   Not on file  Tobacco Use   Smoking status: Never   Smokeless tobacco: Never  Vaping Use   Vaping Use: Never used  Substance and Sexual Activity   Alcohol use: Not Currently   Drug use: Never   Sexual activity: Yes    Birth control/protection: Surgical  Other Topics Concern   Not on file  Social History Narrative   Not on file   Social Determinants of Health   Financial Resource Strain: Not on file  Food Insecurity: Not on file  Transportation Needs: Not on file  Physical Activity: Not on file  Stress: Not on file  Social Connections: Not on file  Intimate Partner Violence: Not on file   Family History  Problem Relation Age of Onset   Breast cancer Maternal Aunt    Past Surgical History:  Procedure Laterality Date   ABDOMINAL HYSTERECTOMY     CHOLECYSTECTOMY       Vanessa Kick, MD 08/03/21 1021

## 2021-08-03 NOTE — Progress Notes (Signed)
Subjective:    Patient ID: Anne Kramer, female    DOB: 1971/09/12, 50 y.o.   MRN: [id number]  HPI She has recurrence of tennis elbow on the right.  She has had it in the past.  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  She has had increasing pain in the elbow more on the right, some on the left.  She has no trauma, no redness, no numbness.  She has used ibuprofen and in the past BioFreeze.  She is concerned it might get worse.  She has had injection in the past.   Review of Systems  Constitutional:  Positive for activity change.  Musculoskeletal:  Positive for arthralgias.  All other systems reviewed and are negative. For Review of Systems, all other systems reviewed and are negative.  The following is a summary of the past history medically, past history surgically, known current medicines, social history and family history.  This information is gathered electronically by the computer from prior information and documentation.  I review this each visit and have found including this information at this point in the chart is beneficial and informative.   Past Medical History:  Diagnosis Date   Abnormal mammogram    Abnormal Pap smear of cervix    Anxiety    Depression    Hyperlipidemia     Past Surgical History:  Procedure Laterality Date   ABDOMINAL HYSTERECTOMY     CHOLECYSTECTOMY      Current Outpatient Medications on File Prior to Visit  Medication Sig Dispense Refill   atorvastatin (LIPITOR) 20 MG tablet Take 1 tablet (20 mg total) by mouth daily. 90 tablet 3   atorvastatin (LIPITOR) 20 MG tablet Take 1 tablet (20 mg total) by mouth daily. 330 tablet 0   buPROPion (WELLBUTRIN) 100 MG tablet Take 1 tablet (100 mg total) by mouth daily. 90 tablet 3   buPROPion (WELLBUTRIN) 100 MG tablet Take 1 tablet (100 mg total) by mouth daily. 330 tablet 0   esomeprazole (NEXIUM) 20 MG capsule Take 1 capsule (20 mg total) by mouth daily at 12 noon. 90  capsule 3   gabapentin (NEURONTIN) 100 MG capsule Take 1 capsule (175m) twice during the day, 8 hours apart as needed. Take 1-3 capsules (1051m- 30079mat night for restless legs and anxiety 150 capsule 6   gabapentin (NEURONTIN) 100 MG capsule Take 1 capsule (100 mg total) by mouth 2 (two) times daily 8 hours apart as needed. Take 1-3  capsules at night for restless legs and anxiety. 150 capsule 5   phentermine 37.5 MG capsule One capsule by mouth qAM. Script 2 of 3 30 capsule 0   phentermine 37.5 MG capsule One capsule by mouth qAM- script 1 of 3 30 capsule 0   venlafaxine XR (EFFEXOR-XR) 150 MG 24 hr capsule Take 1 capsule (150 mg total) by mouth daily with breakfast. 90 capsule 3   blood glucose meter kit and supplies Dispense based on patient and insurance preference. Use up to four times daily as directed. (FOR ICD-10 R73.03, Z68.38) 1 each 0   No current facility-administered medications on file prior to visit.    Social History   Socioeconomic History   Marital status: Married    Spouse name: Not on file   Number of children: Not on file   Years of education: Not on file   Highest education level: Not on file  Occupational History   Not on file  Tobacco Use   Smoking status: Never   Smokeless tobacco: Never  Vaping Use   Vaping Use: Never used  Substance and Sexual Activity   Alcohol use: Not Currently   Drug use: Never   Sexual activity: Yes    Birth control/protection: Surgical  Other Topics Concern   Not on file  Social History Narrative   Not on file   Social Determinants of Health   Financial Resource Strain: Not on file  Food Insecurity: Not on file  Transportation Needs: Not on file  Physical Activity: Not on file  Stress: Not on file  Social Connections: Not on file  Intimate Partner Violence: Not on file    Family History  Problem Relation Age of Onset   Breast cancer Maternal Aunt     BP (!) 136/92   Pulse 99   Ht _0  (1.651 m)   Wt 220 lb  (99.8 kg)   BMI 36.61 kg/m   Body mass index is 36.61 kg/m.     Objective:   Physical Exam Vitals and nursing note reviewed. Exam conducted with a chaperone present.  Constitutional:      Appearance: She is well-developed.  HENT:     Head: Normocephalic and atraumatic.  Eyes:     Conjunctiva/sclera: Conjunctivae normal.     Pupils: Pupils are equal, round, and reactive to light.  Cardiovascular:     Rate and Rhythm: Normal rate and regular rhythm.  Pulmonary:     Effort: Pulmonary effort is normal.  Abdominal:     Palpations: Abdomen is soft.  Musculoskeletal:       Arms:     Cervical back: Normal range of motion and neck supple.  Skin:    General: Skin is warm and dry.  Neurological:     Mental Status: She is alert and oriented to person, place, and time.     Cranial Nerves: No cranial nerve deficit.     Motor: No abnormal muscle tone.     Coordination: Coordination normal.     Deep Tendon Reflexes: Reflexes are normal and symmetric. Reflexes normal.  Psychiatric:        Behavior: Behavior normal.        Thought Content: Thought content normal.        Judgment: Judgment normal.          Assessment & Plan:   Encounter Diagnosis  Name Primary?   Lateral epicondylitis, right elbow Yes   I have explained that this will take time.  I have explained ice massage and use of ibuprofen.  Do the ice massage at least three times a day.  I will see as needed.  She might need injection if it gets worse.  Call if any problem.  Precautions discussed.  Electronically Signed Sanjuana Kava, MD 9/6/20228:46 AM

## 2021-08-03 NOTE — Discharge Instructions (Addendum)
You may use over the counter ibuprofen or acetaminophen as needed.  °For a sore throat, over the counter products such as [employer]gate Peroxyl Mouth Sore Rinse or Chloraseptic Sore Throat Spray may provide some temporary relief. ° ° ° ° °

## 2021-08-03 NOTE — ED Triage Notes (Signed)
Sore throat that started on Friday of last week.  Neg at home covid test.

## 2021-08-10 ENCOUNTER — Other Ambulatory Visit (HOSPITAL_COMMUNITY): Payer: Self-pay

## 2021-08-12 ENCOUNTER — Encounter (HOSPITAL_BASED_OUTPATIENT_CLINIC_OR_DEPARTMENT_OTHER): Payer: Self-pay | Admitting: Nurse Practitioner

## 2021-08-12 ENCOUNTER — Other Ambulatory Visit (HOSPITAL_BASED_OUTPATIENT_CLINIC_OR_DEPARTMENT_OTHER): Payer: Self-pay | Admitting: Nurse Practitioner

## 2021-08-12 DIAGNOSIS — K219 Gastro-esophageal reflux disease without esophagitis: Secondary | ICD-10-CM

## 2021-08-12 MED ORDER — AMOXICILLIN-POT CLAVULANATE 875-125 MG PO TABS: 1.0000 | ORAL_TABLET | Freq: Two times a day (BID) | ORAL | 0 refills | Status: DC

## 2021-08-12 MED ORDER — ESOMEPRAZOLE MAGNESIUM 20 MG PO CPDR: 20.0000 mg | DELAYED_RELEASE_CAPSULE | Freq: Two times a day (BID) | ORAL | 3 refills | Status: DC

## 2021-08-23 NOTE — Progress Notes (Signed)
Virtual Visit Encounter  telephone visit.   I connected with  Anne Kramer on 08/24/21 at 10:30 AM EDT by secure audio and/or video enabled telemedicine application. I verified that I am speaking with the correct person using two identifiers.   I introduced myself as a Designer, jewellery with the practice. The limitations of evaluation and management by telemedicine discussed with the patient and the availability of in person appointments. The patient expressed verbal understanding and consent to proceed.  Participating parties in this visit include: Myself and patient  The patient is: Patient Location: Home I am: Provider Location: Office/Clinic Subjective:    CC and HPI: Anne Kramer is a 50 y.o. year old female presenting for new evaluation and treatment of weight and pre-diabetes. She was started on metformin at her last visit, but was unable to tolerate this medication due to GI distress. She elected to monitor her blood sugars and diet closely before restarting any medications.  She reports her blood sugars have been 148 at the highest. She endorses a late dinner the night before with pizza. She tells me typically her blood sugars are around 100 with lowest down to 79.  She is watching her diet closely and engaging in horseback riding three times a walk and has a job that requires a lot of walking.  She reports she only notices elevated blood sugars when she has eaten something she knows she shouldn't  She took the phentermine for about 30 days and reported that she only saw a weight loss of 2 lbs and determined that she no longer wanted to take this since it was not very effective.  She tells me her weight has been between 218-222 lbs.   She did recently have a sinus infection and reports that shortly after starting the augmentin her symptoms resolved and she is feeling much better now.   She denies any new or concerning symptoms today.   Past medical history, Surgical  history, Family history not pertinant except as noted below, Social history, Allergies, and medications have been entered into the medical record, reviewed, and corrections made.   Review of Systems:  All review of systems negative except what is listed in the HPI  Objective:    Alert and oriented x 4 Speaking in clear sentences with no shortness of breath. No distress.  Impression and Recommendations:    Problem List Items Addressed This Visit     BMI 38.0-38.9,adult - Primary    Weight holding steady No significant change in weight with phentermine. She is working on diet and exercise so we will see how she does with that and determine if additional measures are needed at her next follow-up in December.       Other hyperlipidemia    Monitoring diet closely and working on increased activity.  Will plan to recheck labs in December with repeat visit.  Continue atorvastatin daily.       Pre-diabetes    Unable to tolerate metformin. She is doing very well with diet and exercise management.  Average blood sugars fasting about 100. Highest 149. She is aware of foods that increase her glucose and is working to avoid them She is riding horses 3 times a week and walking a lot at work No concerns or new symptoms today Recommend continue to monitor diet and activity and we will recheck A1c in December       current treatment plan is effective, no change in therapy, orders and follow up  as documented in EMR I discussed the assessment and treatment plan with the patient. The patient was provided an opportunity to ask questions and all were answered. The patient agreed with the plan and demonstrated an understanding of the instructions.   The patient was advised to call back or seek an in-person evaluation if the symptoms worsen or if the condition fails to improve as anticipated.  Follow-Up: scheduled for December  I provided 20 minutes of non-face-to-face interaction with this non  face-to-face encounter including intake, same-day documentation, and chart review.   Orma Render, NP , DNP, AGNP-c [place] [facility] [phone] (fax)

## 2021-08-24 ENCOUNTER — Other Ambulatory Visit: Payer: Self-pay

## 2021-08-24 ENCOUNTER — Telehealth (INDEPENDENT_AMBULATORY_CARE_PROVIDER_SITE_OTHER): Payer: 59 | Admitting: Nurse Practitioner

## 2021-08-24 ENCOUNTER — Encounter (HOSPITAL_BASED_OUTPATIENT_CLINIC_OR_DEPARTMENT_OTHER): Payer: Self-pay | Admitting: Nurse Practitioner

## 2021-08-24 VITALS — BP 137/83 | HR 93 | Temp 98.4°F | Ht 65.0 in | Wt 222.5 lb

## 2021-08-24 DIAGNOSIS — Z6838 Body mass index (BMI) 38.0-38.9, adult: Secondary | ICD-10-CM

## 2021-08-24 DIAGNOSIS — E7849 Other hyperlipidemia: Secondary | ICD-10-CM

## 2021-08-24 DIAGNOSIS — R7303 Prediabetes: Secondary | ICD-10-CM

## 2021-08-24 NOTE — Assessment & Plan Note (Signed)
Unable to tolerate metformin. She is doing very well with diet and exercise management.  Average blood sugars fasting about 100. Highest 149. She is aware of foods that increase her glucose and is working to avoid them She is riding horses 3 times a week and walking a lot at work No concerns or new symptoms today Recommend continue to monitor diet and activity and we will recheck A1c in December

## 2021-08-24 NOTE — Assessment & Plan Note (Signed)
Monitoring diet closely and working on increased activity.  Will plan to recheck labs in December with repeat visit.  Continue atorvastatin daily.

## 2021-08-24 NOTE — Assessment & Plan Note (Signed)
Weight holding steady No significant change in weight with phentermine. She is working on diet and exercise so we will see how she does with that and determine if additional measures are needed at her next follow-up in December.

## 2021-08-27 ENCOUNTER — Other Ambulatory Visit (HOSPITAL_BASED_OUTPATIENT_CLINIC_OR_DEPARTMENT_OTHER): Payer: Self-pay | Admitting: Nurse Practitioner

## 2021-09-02 ENCOUNTER — Other Ambulatory Visit (HOSPITAL_COMMUNITY): Payer: Self-pay

## 2021-09-07 ENCOUNTER — Other Ambulatory Visit (HOSPITAL_COMMUNITY): Payer: Self-pay

## 2021-09-07 ENCOUNTER — Encounter (HOSPITAL_BASED_OUTPATIENT_CLINIC_OR_DEPARTMENT_OTHER): Payer: Self-pay | Admitting: Nurse Practitioner

## 2021-09-07 DIAGNOSIS — R0683 Snoring: Secondary | ICD-10-CM

## 2021-09-07 DIAGNOSIS — K219 Gastro-esophageal reflux disease without esophagitis: Secondary | ICD-10-CM

## 2021-09-07 MED ORDER — ESOMEPRAZOLE MAGNESIUM 20 MG PO CPDR
20.0000 mg | DELAYED_RELEASE_CAPSULE | Freq: Two times a day (BID) | ORAL | 3 refills | Status: DC
  Filled 2021-09-07: qty 90, 45d supply, fill #0

## 2021-09-07 NOTE — Telephone Encounter (Signed)
Referral to [facility] for sleep study sent for snoring.

## 2021-09-07 NOTE — Addendum Note (Signed)
Addended by: Charon Smedberg, Clarise Cruz E on: 09/07/2021 06:34 PM   Modules accepted: Orders

## 2021-09-09 ENCOUNTER — Telehealth (HOSPITAL_BASED_OUTPATIENT_CLINIC_OR_DEPARTMENT_OTHER): Payer: Self-pay

## 2021-09-09 ENCOUNTER — Other Ambulatory Visit (HOSPITAL_COMMUNITY): Payer: Self-pay

## 2021-09-09 DIAGNOSIS — K219 Gastro-esophageal reflux disease without esophagitis: Secondary | ICD-10-CM

## 2021-09-09 MED ORDER — ESOMEPRAZOLE MAGNESIUM 40 MG PO CPDR
40.0000 mg | DELAYED_RELEASE_CAPSULE | Freq: Every day | ORAL | 3 refills | Status: DC
  Filled 2021-09-09: qty 90, 90d supply, fill #0
  Filled 2021-12-05: qty 90, 90d supply, fill #1
  Filled 2022-03-09: qty 90, 90d supply, fill #2
  Filled 2022-05-31: qty 90, 90d supply, fill #3

## 2021-09-09 NOTE — Telephone Encounter (Signed)
Received a PA request for esomprazole Per insurance PA required for 2 cpasules daily but not for 1 40 mg tablet daily Will send new RX with sig 40 mg take 1 tablet by mouth daily

## 2021-09-14 ENCOUNTER — Other Ambulatory Visit (HOSPITAL_COMMUNITY): Payer: Self-pay

## 2021-09-20 ENCOUNTER — Other Ambulatory Visit (HOSPITAL_COMMUNITY): Payer: Self-pay

## 2021-11-02 ENCOUNTER — Other Ambulatory Visit (HOSPITAL_COMMUNITY): Payer: Self-pay

## 2021-11-08 ENCOUNTER — Other Ambulatory Visit (HOSPITAL_BASED_OUTPATIENT_CLINIC_OR_DEPARTMENT_OTHER): Payer: Self-pay | Admitting: Orthopaedic Surgery

## 2021-11-08 DIAGNOSIS — M25521 Pain in right elbow: Secondary | ICD-10-CM

## 2021-11-08 DIAGNOSIS — M79641 Pain in right hand: Secondary | ICD-10-CM

## 2021-11-09 ENCOUNTER — Encounter (HOSPITAL_BASED_OUTPATIENT_CLINIC_OR_DEPARTMENT_OTHER): Payer: Self-pay | Admitting: Nurse Practitioner

## 2021-11-09 ENCOUNTER — Other Ambulatory Visit (HOSPITAL_BASED_OUTPATIENT_CLINIC_OR_DEPARTMENT_OTHER): Payer: Self-pay

## 2021-11-09 ENCOUNTER — Ambulatory Visit (HOSPITAL_BASED_OUTPATIENT_CLINIC_OR_DEPARTMENT_OTHER): Payer: 59 | Admitting: Orthopaedic Surgery

## 2021-11-09 ENCOUNTER — Ambulatory Visit (HOSPITAL_BASED_OUTPATIENT_CLINIC_OR_DEPARTMENT_OTHER): Payer: 59 | Admitting: Nurse Practitioner

## 2021-11-09 ENCOUNTER — Other Ambulatory Visit: Payer: Self-pay

## 2021-11-09 VITALS — BP 122/72 | HR 95 | Ht 65.0 in | Wt 228.9 lb

## 2021-11-09 DIAGNOSIS — Z6838 Body mass index (BMI) 38.0-38.9, adult: Secondary | ICD-10-CM | POA: Diagnosis not present

## 2021-11-09 DIAGNOSIS — M25521 Pain in right elbow: Secondary | ICD-10-CM | POA: Insufficient documentation

## 2021-11-09 DIAGNOSIS — R7303 Prediabetes: Secondary | ICD-10-CM | POA: Diagnosis not present

## 2021-11-09 DIAGNOSIS — E559 Vitamin D deficiency, unspecified: Secondary | ICD-10-CM | POA: Diagnosis not present

## 2021-11-09 DIAGNOSIS — E7849 Other hyperlipidemia: Secondary | ICD-10-CM | POA: Diagnosis not present

## 2021-11-09 DIAGNOSIS — M79644 Pain in right finger(s): Secondary | ICD-10-CM | POA: Insufficient documentation

## 2021-11-09 MED ORDER — SEMAGLUTIDE(0.25 OR 0.5MG/DOS) 2 MG/1.5ML ~~LOC~~ SOPN
PEN_INJECTOR | SUBCUTANEOUS | 1 refills | Status: DC
  Filled 2021-11-09: qty 1.5, 28d supply, fill #0
  Filled 2021-12-16: qty 1.5, 28d supply, fill #1
  Filled 2022-01-20: qty 1.5, 28d supply, fill #2
  Filled 2022-02-23 – 2022-03-24 (×2): qty 1.5, 28d supply, fill #3

## 2021-11-09 MED ORDER — ATORVASTATIN CALCIUM 20 MG PO TABS
20.0000 mg | ORAL_TABLET | Freq: Every day | ORAL | 3 refills | Status: DC
  Filled 2021-11-09 – 2021-12-16 (×2): qty 90, 90d supply, fill #0
  Filled 2022-03-09: qty 90, 90d supply, fill #1

## 2021-11-09 MED ORDER — ONDANSETRON HCL 4 MG PO TABS
4.0000 mg | ORAL_TABLET | Freq: Three times a day (TID) | ORAL | 0 refills | Status: DC | PRN
  Filled 2021-11-09: qty 20, 7d supply, fill #0

## 2021-11-09 NOTE — Patient Instructions (Signed)
We will check your labs again today and see how you are doing with diet and exercise.   One thing we can consider is a medication class called a GLP-1. This medication works to help reduce blood sugar and also helps to reduce cardiovascular risks by decreasing weight and cholesterol levels through slower movement of food through the stomach, decreasing the hunger response, and making your cells use insulin better so that sugar is used in the body. We have seen very good results with this medication.

## 2021-11-09 NOTE — Assessment & Plan Note (Signed)
Elevated lipids in setting of elevated BMI and Pre-DM.  Will monitor labs today Discussed medication management with GLP-1 inhibitor.  Patient interested- coupon provided and rx sent to pharmacy.  Diet and activity education provided.  Atorvastatin refills provided.  F/U in 3 months

## 2021-11-09 NOTE — Assessment & Plan Note (Signed)
Weight gain of 6 pounds since last visit despite dietary monitoring.  In the setting of Pre-DM and HLD, concern for negative CV impact.  Discussed medication options today with GLP-1 inhibitor, semaglutide for management.  She is interested in this treatment as she has not been successful on her own.  Labs today.  Hx reviewed and negative for pancreatitis or medullary thyroid cancer.  Will send rx to pharmacy and f/u in 3 months.  Diet and activity education provided.

## 2021-11-09 NOTE — Assessment & Plan Note (Addendum)
Hx of tennis elbow causing pain  Suspect exacerbation of previous injury Recommend follow through with visit to orthopedics for evaluation for injection- last worked well for 6 years.

## 2021-11-09 NOTE — Assessment & Plan Note (Signed)
Working on diet and activity levels for management.  Tried and failed metformin.  BG values look fairly good with reported levels, but elevated AM glucose has concern for uncontrolled night time symptoms.  Patients weight has increased since last visit, as well.  Today will obtain labs and plan to start Ozempic for HLD, weight, and Pre-DM.  Education provided on blood sugar, medication, expectations, diet, and activity.  Recommend increase water intake, walk at least 20 minutes daily with purposeful exercise, consume 3 small balanced meals a day with snacks to equal no more than 150g carbohydrates and 13g of saturated fat.  Increase fiber intake to 30g per day.  Education on medication administration today.  F/U in 3 months or sooner if needed.

## 2021-11-09 NOTE — Progress Notes (Signed)
Established Patient Office Visit  Subjective:  Patient ID: Anne Kramer, female    DOB: 1971-11-18  Age: 50 y.o. MRN: [id number]  CC:  Chief Complaint  Patient presents with   6 month follow up    Patient comes in today for 6 months follow up. Her blood sugar is doing great her numbers range from 83-119 daily. She would like to discuss weight loss. She does need a refill on Atorvastin today She is having trouble with right hand and elbow pain would like to discuss steroid injection.    HPI Anne Kramer presents for follow-up for elevated BMI, pre-diabetes, and HLD.   At her last visit we discussed she was unable to tolerate metformin and this was stopped.  She also stopped the phentermine as she was not seeing any substantial weight loss with this.  She was working on diet and exercise to see if she could get better control of her weight, cholesterol, and blood sugars.   She has been checking her blood sugars at home and these average in the 80's to 110's. She reports her blood sugars are often highest first thing in the morning.  She is watching what she eats and eats small portions. Occasionally she will skip meals. She is working to avoid fat and carbohydrates.  She is frustrated at her lack of weight loss given her dietary habits.  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 She gets about 6,000 steps in a day.   She does feel that she is urinating a lot and feels her abdomen is bloated. She is also experiencing some edema in her hands when waking in the morning.   No foot swelling, slow healing wounds, infections, yeast.  She is taking atorvastatin daily and tolerating well. She is taking gabapentin 357m at bedtime.   She reports a long history of pain in her elbow and right thumb. She has been treated with steroid injections for tennis elbow and trigger finger in the past. She  feels that this would be helpful at this time. She has an appointment with orthopedics today.  Past Medical History:  Diagnosis Date   Abnormal mammogram    Abnormal Pap smear of cervix    Anxiety    Depression    Diabetes mellitus without complication ([place]    Hyperlipidemia     Past Surgical History:  Procedure Laterality Date   ABDOMINAL HYSTERECTOMY     CHOLECYSTECTOMY      Family History  Problem Relation Age of Onset   Breast cancer Maternal Aunt     Social History   Socioeconomic History   Marital status: Married    Spouse name: Not on file   Number of children: Not on file   Years of education: Not on file   Highest education level: Not on file  Occupational History   Not on file  Tobacco Use   Smoking status: Never   Smokeless tobacco: Never  Vaping Use   Vaping Use: Never used  Substance and Sexual Activity   Alcohol use: Not Currently   Drug use: Never   Sexual activity: Yes    Birth control/protection: Surgical  Other Topics Concern   Not on file  Social History Narrative   Not on file   Social Determinants of Health   Financial Resource Strain: Not on file  Food Insecurity: Not on file  Transportation Needs: Not on file  Physical Activity: Not on file  Stress: Not on file  Social Connections: Not on file  Intimate Partner Violence: Not on file    Outpatient Medications Prior to Visit  Medication Sig Dispense Refill   blood glucose meter kit and supplies Dispense based on patient and insurance preference. Use up to four times daily as directed. (FOR ICD-10 R73.03, Z68.38) 1 each 0   buPROPion (WELLBUTRIN) 100 MG tablet Take 1 tablet (100 mg total) by mouth daily. 330 tablet 0   esomeprazole (NEXIUM) 40 MG capsule Take 1 capsule (40 mg total) by mouth daily. 90 capsule 3   gabapentin (NEURONTIN) 100 MG capsule Take 1 capsule (100 mg total) by mouth 2 (two) times daily 8 hours apart as needed. Take 1-3  capsules at night for restless legs and  anxiety. 150 capsule 5   venlafaxine XR (EFFEXOR-XR) 150 MG 24 hr capsule Take 1 capsule (150 mg total) by mouth daily with breakfast. 90 capsule 3   atorvastatin (LIPITOR) 20 MG tablet Take 1 tablet (20 mg total) by mouth daily. 90 tablet 3   atorvastatin (LIPITOR) 20 MG tablet Take 1 tablet (20 mg total) by mouth daily. 330 tablet 0   buPROPion (WELLBUTRIN) 100 MG tablet Take 1 tablet (100 mg total) by mouth daily. 90 tablet 3   gabapentin (NEURONTIN) 100 MG capsule Take 1 capsule (163m) twice during the day, 8 hours apart as needed. Take 1-3 capsules (1046m- 30062mat night for restless legs and anxiety 150 capsule 6   No facility-administered medications prior to visit.    No Known Allergies  ROS Review of Systems All review of systems negative except what is listed in the HPI    Objective:    Physical Exam Vitals and nursing note reviewed.  Constitutional:      Appearance: Normal appearance.  HENT:     Head: Normocephalic.  Eyes:     Extraocular Movements: Extraocular movements intact.     Conjunctiva/sclera: Conjunctivae normal.     Pupils: Pupils are equal, round, and reactive to light.  Neck:     Vascular: No carotid bruit.  Cardiovascular:     Rate and Rhythm: Normal rate and regular rhythm.     Pulses: Normal pulses.     Heart sounds: Normal heart sounds. No murmur heard. Pulmonary:     Effort: Pulmonary effort is normal.     Breath sounds: Normal breath sounds. No wheezing.  Abdominal:     General: Bowel sounds are normal. There is no distension.     Palpations: Abdomen is soft.     Tenderness: There is no abdominal tenderness. There is no right CVA tenderness, left CVA tenderness or guarding.  Musculoskeletal:        General: Tenderness present.     Cervical back: Normal range of motion.     Right lower leg: No edema.     Left lower leg: No edema.     Comments: Mild, non-pitting edema in hands bilaterally.   Lymphadenopathy:     Cervical: No cervical  adenopathy.  Skin:    General: Skin is warm and dry.     Capillary Refill: Capillary refill takes less than 2 seconds.  Neurological:     General: No focal deficit present.     Mental Status: She is alert and oriented to person, place, and time.     Sensory: No sensory deficit.     Motor: No weakness.  Psychiatric:  Mood and Affect: Mood normal.        Behavior: Behavior normal.        Thought Content: Thought content normal.        Judgment: Judgment normal.    BP 122/72   Pulse 95   Ht 5' 5"  (1.651 m)   Wt 228 lb 14.4 oz (103.8 kg)   SpO2 96%   BMI 38.09 kg/m  Wt Readings from Last 3 Encounters:  11/09/21 228 lb 14.4 oz (103.8 kg)  08/24/21 222 lb 8 oz (100.9 kg)  08/03/21 220 lb (99.8 kg)     Health Maintenance Due  Topic Date Due   PAP SMEAR-Modifier  Never done   COLONOSCOPY (Pts 45-44yr Insurance coverage will need to be confirmed)  Never done   COVID-19 Vaccine (3 - Booster for [facility]) 08/27/2020   Zoster Vaccines- Shingrix (1 of 2) Never done    There are no preventive care reminders to display for this patient.  Lab Results  Component Value Date   TSH 0.608 05/06/2021   Lab Results  Component Value Date   WBC 6.6 05/06/2021   HGB 13.8 05/06/2021   HCT 41.3 05/06/2021   MCV 86 05/06/2021   PLT 351 05/06/2021   Lab Results  Component Value Date   NA 138 05/06/2021   K 4.4 05/06/2021   CO2 20 05/06/2021   GLUCOSE 98 05/06/2021   BUN 13 05/06/2021   CREATININE 0.97 05/06/2021   BILITOT 0.6 05/06/2021   ALKPHOS 139 (H) 05/06/2021   AST 22 05/06/2021   ALT 27 05/06/2021   PROT 7.5 05/06/2021   ALBUMIN 4.7 05/06/2021   CALCIUM 10.3 (H) 05/06/2021   EGFR 71 05/06/2021   Lab Results  Component Value Date   CHOL 280 (H) 05/06/2021   Lab Results  Component Value Date   HDL 56 05/06/2021   Lab Results  Component Value Date   LDLCALC 203 (H) 05/06/2021   Lab Results  Component Value Date   TRIG 118 05/06/2021   Lab Results   Component Value Date   CHOLHDL 5.0 (H) 05/06/2021   Lab Results  Component Value Date   HGBA1C 5.9 05/06/2021      Assessment & Plan:   Problem List Items Addressed This Visit     BMI 38.0-38.9,adult    Weight gain of 6 pounds since last visit despite dietary monitoring.  In the setting of Pre-DM and HLD, concern for negative CV impact.  Discussed medication options today with GLP-1 inhibitor, semaglutide for management.  She is interested in this treatment as she has not been successful on her own.  Labs today.  Hx reviewed and negative for pancreatitis or medullary thyroid cancer.  Will send rx to pharmacy and f/u in 3 months.  Diet and activity education provided.       Relevant Medications   Semaglutide,0.25 or 0.5MG/DOS, 2 MG/1.5ML SOPN   Other Relevant Orders   CBC with Differential/Platelet   Comprehensive metabolic panel   Lipid panel   Hemoglobin A1c   Other hyperlipidemia    Elevated lipids in setting of elevated BMI and Pre-DM.  Will monitor labs today Discussed medication management with GLP-1 inhibitor.  Patient interested- coupon provided and rx sent to pharmacy.  Diet and activity education provided.  Atorvastatin refills provided.  F/U in 3 months      Relevant Medications   Semaglutide,0.25 or 0.5MG/DOS, 2 MG/1.5ML SOPN   atorvastatin (LIPITOR) 20 MG tablet   Other Relevant Orders  CBC with Differential/Platelet   Comprehensive metabolic panel   Lipid panel   Hemoglobin A1c   Pre-diabetes - Primary    Working on diet and activity levels for management.  Tried and failed metformin.  BG values look fairly good with reported levels, but elevated AM glucose has concern for uncontrolled night time symptoms.  Patients weight has increased since last visit, as well.  Today will obtain labs and plan to start Ozempic for HLD, weight, and Pre-DM.  Education provided on blood sugar, medication, expectations, diet, and activity.  Recommend increase  water intake, walk at least 20 minutes daily with purposeful exercise, consume 3 small balanced meals a day with snacks to equal no more than 150g carbohydrates and 13g of saturated fat.  Increase fiber intake to 30g per day.  Education on medication administration today.  F/U in 3 months or sooner if needed.       Relevant Medications   Semaglutide,0.25 or 0.5MG/DOS, 2 MG/1.5ML SOPN   Other Relevant Orders   CBC with Differential/Platelet   Comprehensive metabolic panel   Lipid panel   Hemoglobin A1c   Elbow pain, right    Hx of tennis elbow causing pain  Suspect exacerbation of previous injury Recommend follow through with visit to orthopedics for evaluation for injection- last worked well for 6 years.       Thumb pain, right    Hx of trigger finger in right thumb with recurrence of pain and symptoms.  Recommend follow through with ortho visit for evaluation and recommendations.        Other Visit Diagnoses     Vitamin D deficiency       Relevant Orders   VITAMIN D 25 Hydroxy (Vit-D Deficiency, Fractures)       Meds ordered this encounter  Medications   Semaglutide,0.25 or 0.5MG/DOS, 2 MG/1.5ML SOPN    Sig: Inject 0.60m once a week for 4 weeks THEN increase to 0.563monce a week.    Dispense:  4.5 mL    Refill:  1   atorvastatin (LIPITOR) 20 MG tablet    Sig: Take 1 tablet (20 mg total) by mouth daily.    Dispense:  90 tablet    Refill:  3   ondansetron (ZOFRAN) 4 MG tablet    Sig: Take 1 tablet (4 mg total) by mouth every 8 (eight) hours as needed for nausea or vomiting.    Dispense:  20 tablet    Refill:  0    Follow-up: Return in about 3 months (around 02/07/2022) for Ozempic f/u virtual ok.    SaOrma RenderNP

## 2021-11-09 NOTE — Assessment & Plan Note (Signed)
Hx of trigger finger in right thumb with recurrence of pain and symptoms.  Recommend follow through with ortho visit for evaluation and recommendations.

## 2021-11-10 LAB — CBC WITH DIFFERENTIAL/PLATELET
Basophils Absolute: 0.1 10*3/uL (ref 0.0–0.2)
Basos: 1 %
EOS (ABSOLUTE): 0.2 10*3/uL (ref 0.0–0.4)
Eos: 4 %
Hematocrit: 41 % (ref 34.0–46.6)
Hemoglobin: 13.2 g/dL (ref 11.1–15.9)
Immature Grans (Abs): 0 10*3/uL (ref 0.0–0.1)
Immature Granulocytes: 0 %
Lymphocytes Absolute: 1.6 10*3/uL (ref 0.7–3.1)
Lymphs: 27 %
MCH: 27.8 pg (ref 26.6–33.0)
MCHC: 32.2 g/dL (ref 31.5–35.7)
MCV: 86 fL (ref 79–97)
Monocytes Absolute: 0.3 10*3/uL (ref 0.1–0.9)
Monocytes: 6 %
Neutrophils Absolute: 3.7 10*3/uL (ref 1.4–7.0)
Neutrophils: 62 %
Platelets: 315 10*3/uL (ref 150–450)
RBC: 4.75 x10E6/uL (ref 3.77–5.28)
RDW: 13 % (ref 11.7–15.4)
WBC: 6 10*3/uL (ref 3.4–10.8)

## 2021-11-10 LAB — COMPREHENSIVE METABOLIC PANEL
ALT: 10 IU/L (ref 0–32)
AST: 14 IU/L (ref 0–40)
Albumin/Globulin Ratio: 2.2 (ref 1.2–2.2)
Albumin: 4.8 g/dL (ref 3.8–4.9)
Alkaline Phosphatase: 115 IU/L (ref 44–121)
BUN/Creatinine Ratio: 15 (ref 9–23)
BUN: 14 mg/dL (ref 6–24)
Bilirubin Total: 0.3 mg/dL (ref 0.0–1.2)
CO2: 23 mmol/L (ref 20–29)
Calcium: 10 mg/dL (ref 8.7–10.2)
Chloride: 104 mmol/L (ref 96–106)
Creatinine, Ser: 0.94 mg/dL (ref 0.57–1.00)
Globulin, Total: 2.2 g/dL (ref 1.5–4.5)
Glucose: 94 mg/dL (ref 70–99)
Potassium: 4.9 mmol/L (ref 3.5–5.2)
Sodium: 143 mmol/L (ref 134–144)
Total Protein: 7 g/dL (ref 6.0–8.5)
eGFR: 73 mL/min/{1.73_m2} (ref >59)

## 2021-11-10 LAB — LIPID PANEL
Chol/HDL Ratio: 3.2 ratio (ref 0.0–4.4)
Cholesterol, Total: 202 mg/dL — ABNORMAL HIGH (ref 100–199)
HDL: 63 mg/dL (ref >39)
LDL Chol Calc (NIH): 125 mg/dL — ABNORMAL HIGH (ref 0–99)
Triglycerides: 76 mg/dL (ref 0–149)
VLDL Cholesterol Cal: 14 mg/dL (ref 5–40)

## 2021-11-10 LAB — HEMOGLOBIN A1C
Est. average glucose Bld gHb Est-mCnc: 120 mg/dL
Hgb A1c MFr Bld: 5.8 % — ABNORMAL HIGH (ref 4.8–5.6)

## 2021-11-10 LAB — VITAMIN D 25 HYDROXY (VIT D DEFICIENCY, FRACTURES): Vit D, 25-Hydroxy: 38.6 ng/mL (ref 30.0–100.0)

## 2021-11-11 ENCOUNTER — Ambulatory Visit (HOSPITAL_BASED_OUTPATIENT_CLINIC_OR_DEPARTMENT_OTHER): Payer: 59 | Admitting: Nurse Practitioner

## 2021-11-28 DIAGNOSIS — C801 Malignant (primary) neoplasm, unspecified: Secondary | ICD-10-CM

## 2021-11-28 HISTORY — DX: Malignant (primary) neoplasm, unspecified: C80.1

## 2021-11-30 ENCOUNTER — Institutional Professional Consult (permissible substitution): Payer: 59 | Admitting: Neurology

## 2021-12-01 ENCOUNTER — Encounter (HOSPITAL_BASED_OUTPATIENT_CLINIC_OR_DEPARTMENT_OTHER): Payer: 59 | Admitting: Family Medicine

## 2021-12-06 ENCOUNTER — Encounter: Payer: Self-pay | Admitting: Neurology

## 2021-12-06 ENCOUNTER — Ambulatory Visit: Payer: 59 | Admitting: Neurology

## 2021-12-06 ENCOUNTER — Other Ambulatory Visit (HOSPITAL_COMMUNITY): Payer: Self-pay

## 2021-12-06 VITALS — BP 124/81 | HR 93 | Ht 65.0 in | Wt 221.5 lb

## 2021-12-06 DIAGNOSIS — Z6836 Body mass index (BMI) 36.0-36.9, adult: Secondary | ICD-10-CM

## 2021-12-06 DIAGNOSIS — G2581 Restless legs syndrome: Secondary | ICD-10-CM

## 2021-12-06 DIAGNOSIS — R351 Nocturia: Secondary | ICD-10-CM | POA: Diagnosis not present

## 2021-12-06 DIAGNOSIS — G478 Other sleep disorders: Secondary | ICD-10-CM

## 2021-12-06 DIAGNOSIS — R0683 Snoring: Secondary | ICD-10-CM | POA: Diagnosis not present

## 2021-12-06 DIAGNOSIS — E6609 Other obesity due to excess calories: Secondary | ICD-10-CM | POA: Insufficient documentation

## 2021-12-06 MED ORDER — PRAMIPEXOLE DIHYDROCHLORIDE 0.125 MG PO TABS: 0.1250 mg | ORAL_TABLET | Freq: Every day | ORAL | 5 refills | Status: DC

## 2021-12-06 MED ORDER — PRAMIPEXOLE DIHYDROCHLORIDE 0.125 MG PO TABS: 0.1250 mg | ORAL_TABLET | Freq: Three times a day (TID) | ORAL | 5 refills | Status: DC

## 2021-12-06 NOTE — Patient Instructions (Signed)
Screening for Sleep Apnea Sleep apnea is a condition in which breathing pauses or becomes shallow during sleep. Sleep apnea screening is a test to determine if you are at risk for sleep apnea. The test includes a series of questions. It will only takes a few minutes. Your health care provider may ask you to have this test in preparation for surgery or as part of a physical exam. What are the symptoms of sleep apnea? Common symptoms of sleep apnea include: Snoring. Waking up often at night. Daytime sleepiness. Pauses in breathing. Choking or gasping during sleep. Irritability. Forgetfulness. Trouble thinking clearly. Depression. Personality changes. Most people with sleep apnea do not know that they have it. What are the advantages of sleep apnea screening? Getting screened for sleep apnea can help: Ensure your safety. It is important for your health care providers to know whether or not you have sleep apnea, especially if you are having surgery or have other long-term (chronic) health conditions. Improve your health and allow you to get a better night's rest. Restful sleep can help you: Have more energy. Lose weight. Improve high blood pressure. Improve diabetes management. Prevent stroke. Prevent car accidents. What happens during the screening? Screening usually includes being asked a list of questions about your sleep quality. Some questions you may be asked include: Do you snore? Is your sleep restless? Do you have daytime sleepiness? Has a partner or spouse told you that you stop breathing during sleep? Have you had trouble concentrating or memory loss? What is your age? What is your neck circumference? To measure your neck, keep your back straight and gently wrap the tape measure around your neck. Put the tape measure at the middle of your neck, between your chin and collarbone. What is your sex assigned at birth? Do you have or are you being treated for high blood  pressure? If your screening test is positive, you are at risk for the condition. Further testing may be needed to confirm a diagnosis of sleep apnea. Where to find more information You can find screening tools online or at your health care clinic. For more information about sleep apnea screening and healthy sleep, visit these websites: Centers for Disease Control and Prevention: http://www.wolf.info/ American Sleep Apnea Association: www.sleepapnea.org Contact a health care provider if: You think that you may have sleep apnea. Summary Sleep apnea screening can help determine if you are at risk for sleep apnea. It is important for your health care providers to know whether or not you have sleep apnea, especially if you are having surgery or have other chronic health conditions. You may be asked to take a screening test for sleep apnea in preparation for surgery or as part of a physical exam. This information is not intended to replace advice given to you by your health care provider. Make sure you discuss any questions you have with your health care provider. Document Revised: 10/23/2020 Document Reviewed: 10/23/2020 Elsevier Patient Education  2022 [place] Sleep Information, Adult Quality sleep is important for your mental and physical health. It also improves your quality of life. Quality sleep means you: Are asleep for most of the time you are in bed. Fall asleep within 30 minutes. Wake up no more than once a night.  Are awake for no longer than 20 minutes if you do wake up during the night. Most adults need 7-8 hours of quality sleep each night. How can poor sleep affect me? If you do not get enough quality sleep,  you may have: Mood swings. Daytime sleepiness. Confusion. Decreased reaction time. Sleep disorders, such as insomnia and sleep apnea. Difficulty with: Solving problems. Coping with stress. Paying attention. These issues may affect your performance and productivity  at work, school, and at home. Lack of sleep may also put you at higher risk for accidents, suicide, and risky behaviors. If you do not get quality sleep you may also be at higher risk for several health problems, including: Infections. Type 2 diabetes. Heart disease. High blood pressure. Obesity. Worsening of long-term conditions, like arthritis, kidney disease, depression, Parkinson's disease, and epilepsy. What actions can I take to get more quality sleep?   Stick to a sleep schedule. Go to sleep and wake up at about the same time each day. Do not try to sleep less on weekdays and make up for lost sleep on weekends. This does not work. Try to get about 30 minutes of exercise on most days. Do not exercise 2-3 hours before going to bed. Limit naps during the day to 30 minutes or less. Do not use any products that contain nicotine or tobacco, such as cigarettes or e-cigarettes. If you need help quitting, ask your health care provider. Do not drink caffeinated beverages for at least 8 hours before going to bed. Coffee, tea, and some sodas contain caffeine. Do not drink alcohol close to bedtime. Do not eat large meals close to bedtime. Do not take naps in the late afternoon. Try to get at least 30 minutes of sunlight every day. Morning sunlight is best. Make time to relax before bed. Reading, listening to music, or taking a hot bath promotes quality sleep. Make your bedroom a place that promotes quality sleep. Keep your bedroom dark, quiet, and at a comfortable room temperature. Make sure your bed is comfortable. Take out sleep distractions like TV, a computer, smartphone, and bright lights. If you are lying awake in bed for longer than 20 minutes, get up and do a relaxing activity until you feel sleepy. Work with your health care provider to treat medical conditions that may affect sleeping, such as: Nasal obstruction. Snoring. Sleep apnea and other sleep disorders. Talk to your health care  provider if you think any of your prescription medicines may cause you to have difficulty falling or staying asleep. If you have sleep problems, talk with a sleep consultant. If you think you have a sleep disorder, talk with your health care provider about getting evaluated by a specialist. Where to find more information [place] website: https://sleepfoundation.org National Heart, Lung, and [place] ([place]): http://www.saunders.info/.pdf Centers for Disease Control and Prevention (CDC): LearningDermatology.pl Contact a health care provider if you: Have trouble getting to sleep or staying asleep. Often wake up very early in the morning and cannot get back to sleep. Have daytime sleepiness. Have daytime sleep attacks of suddenly falling asleep and sudden muscle weakness (narcolepsy). Have a tingling sensation in your legs with a strong urge to move your legs (restless legs syndrome). Stop breathing briefly during sleep (sleep apnea). Think you have a sleep disorder or are taking a medicine that is affecting your quality of sleep. Summary Most adults need 7-8 hours of quality sleep each night. Getting enough quality sleep is an important part of health and well-being. Make your bedroom a place that promotes quality sleep and avoid things that may cause you to have poor sleep, such as alcohol, caffeine, smoking, and large meals. Talk to your health care provider if you have trouble falling  asleep or staying asleep. This information is not intended to replace advice given to you by your health care provider. Make sure you discuss any questions you have with your health care provider. Document Revised: 02/21/2018 Document Reviewed: 02/21/2018 Elsevier Patient Education  [place].

## 2021-12-06 NOTE — Addendum Note (Signed)
Addended by: Larey Seat on: 12/06/2021 10:06 AM   Modules accepted: Orders

## 2021-12-06 NOTE — Progress Notes (Signed)
SLEEP MEDICINE CLINIC    Provider:  Larey Seat, MD  Primary Care Physician:  Orma Render, NP [address] [address]     Referring Provider: Orma Render, Np [address] [place],  [place] [address]          Chief Complaint according to patient   Patient presents with:     New Patient (Initial Visit)     Internal referral for habitual snoring. 12-06-2021: No prior SS. Pt reports not being able to sleep through the nights for years. Wakes up 3x at night, is able to go back to sleep. Waking up tired and constantly yawning . Mouth feels dry.       HISTORY OF PRESENT ILLNESS:  Anne Kramer is a 51 y.o. year old White or Caucasian female patient seen here upon referral on 12/06/2021 from NP Early after a telephone visit , for a Sleep consultation. .  Chief concern according to patient :   Anne Kramer is an established patient of nurse practitioner early whom she spoke to already on 9-27 2022 mentioning her snoring her husband having witnessed her to pause in her breath and then show up or struggle causing her to wake up.  She has been guided for medical weight loss in the setting of prediabetes and on the [place] mind was able to lose only 2 pounds that was fairly ineffective for her.  Her BMI has been around 38 but now is 36.8.  She has hyperlipidemia and again prediabetes she was started on Ozempic which has initiated more weight loss- she is also on Effexor which can help with compulsive eating habits. She feels her sleep has been forever poor quality since the birth of her only child, a son, 23 years ago.     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 Covid infection 10-2021, husband ( unvaccinated )got it and her  mother ended up  hospitalized.    Sleep relevant medical history: Nocturia 3-5 times , every 2 hours-.   Family medical /sleep history: no other family member on CPAP with OSA.    Social history:  Patient is working as a Advice worker , she lives in a household with spouse,  2 dogs. Their son is 24, daughter is 92 years old. The patient currently works daytime, 12 hour shifts.   Tobacco use: never, but exposed to second hand smoke-  ETOH use rarely,  2 cups in AM :Coffee( ) Soda( 2-3 a day). Regular exercise in form of walking.   She rides. Her horse is stabled at  [employer].   Sleep habits are as follows: The patient's dinner time is between 7-9 PM.  The patient goes to bed at 10-11 PM and continues to sleep for intervals of 2 hours, wakes for nocturia  bathroom breaks, the first time at 1 AM.  Husband has very irregular hours.  The preferred sleep position is on her sides, with the support of one memory foam pillow.  Dreams are reportedly rare/ infrequent  6  AM is the usual rise time. The patient wakes up with an alarm.  She reports not feeling refreshed or restored in AM, with symptoms such as dry mouth, morning headaches, and residual fatigue. Naps are taken infrequently,  she can't fall asleep- mind racing.    Review of Systems: Out of a complete 14 system review, the patient complains of only the following symptoms, and all other reviewed systems are negative.:  Fatigue, sleepiness , snoring, fragmented sleep, Nocturia, Fatigue- morning headaches.    How likely are you to doze in the following situations: 0 = not likely, 1 = slight chance, 2 = moderate chance, 3 = high chance   Sitting and Reading? Watching Television? Sitting inactive in a public place (theater or meeting)? As a passenger in a car for an hour without a break? Lying down in the afternoon when circumstances permit? Sitting and talking to someone? Sitting quietly after lunch without alcohol? In a car, while  stopped for a few minutes in traffic?   Total = 7-9/ 24 points   FSS endorsed at 56/ 63 points.   Social History   Socioeconomic History   Marital status: Married    Spouse name: Roselyn Reef   Number of children: 2   Years of education: Not on file   Highest education level: Associate degree: occupational, Hotel manager, or vocational program  Occupational History   Not on file  Tobacco Use   Smoking status: Never   Smokeless tobacco: Never  Vaping Use   Vaping Use: Never used  Substance and Sexual Activity   Alcohol use: Yes    Comment: once a month   Drug use: Never   Sexual activity: Yes    Birth control/protection: Surgical  Other Topics Concern   Not on file  Social History Narrative   Lives at home with husband   Right handed   Caffeine: 4 caffeine drinks a day   Social Determinants of Radio broadcast assistant Strain: Not on file  Food Insecurity: Not on file  Transportation Needs: Not on file  Physical Activity: Not on file  Stress: Not on file  Social Connections: Not on file    Family History  Problem Relation Age of Onset   High blood pressure Mother    High Cholesterol Mother    Depression Mother    Anxiety disorder Mother    Atrial fibrillation Mother    Thyroid disease Mother    High blood pressure Father    Heart disease Father    High Cholesterol Sister    Cancer Sister    Breast cancer Maternal Aunt    Breast cancer Paternal Grandmother     Past Medical History:  Diagnosis Date   Abnormal mammogram    Abnormal Pap smear of cervix    Anxiety    Depression    Diabetes mellitus without complication ([place])    Hyperlipidemia     Past Surgical History:  Procedure Laterality Date   ABDOMINAL HYSTERECTOMY     CHOLECYSTECTOMY       Current Outpatient Medications on File Prior to Visit  Medication Sig Dispense Refill   atorvastatin (LIPITOR) 20 MG tablet Take 1 tablet (20 mg total) by mouth daily. 90 tablet 3   blood glucose meter kit and  supplies Dispense based on patient and insurance preference. Use up to four times daily as directed. (FOR ICD-10 R73.03, Z68.38) 1 each 0   buPROPion (WELLBUTRIN) 100 MG tablet Take 1 tablet (100 mg total) by mouth daily. 330 tablet 0   esomeprazole (NEXIUM) 40 MG capsule Take 1 capsule (40 mg total) by mouth daily. 90 capsule 3   gabapentin (NEURONTIN) 100 MG capsule Take 1 capsule (100 mg total) by mouth 2 (  two) times daily 8 hours apart as needed. Take 1-3  capsules at night for restless legs and anxiety. 150 capsule 5   ondansetron (ZOFRAN) 4 MG tablet Take 1 tablet (4 mg total) by mouth every 8 (eight) hours as needed for nausea or vomiting. 20 tablet 0   Semaglutide,0.25 or 0.5MG/DOS, 2 MG/1.5ML SOPN Inject 0.46m once a week for 4 weeks THEN increase to 0.578monce a week. 4.5 mL 1   venlafaxine XR (EFFEXOR-XR) 150 MG 24 hr capsule Take 1 capsule (150 mg total) by mouth daily with breakfast. 90 capsule 3   No current facility-administered medications on file prior to visit.    Allergies  Allergen Reactions   Kiwi Extract Swelling    Makes lips swell    Physical exam:  Today's Vitals   12/06/21 0853  BP: 124/81  Pulse: 93  Weight: 221 lb 8 oz (100.5 kg)  Height: _0  (1.651 m)   Body mass index is 36.86 kg/m.   Wt Readings from Last 3 Encounters:  12/06/21 221 lb 8 oz (100.5 kg)  11/09/21 228 lb 14.4 oz (103.8 kg)  08/24/21 222 lb 8 oz (100.9 kg)     Ht Readings from Last 3 Encounters:  12/06/21 _1  (1.651 m)  11/09/21 _2  (1.651 m)  08/24/21 _3  (1.651 m)      General: The patient is awake, alert and appears not in acute distress. The patient is well groomed. Head: Normocephalic, atraumatic. Neck is supple.  Mallampati 1- but lateral restriction- tonsils,  neck circumference:14.5  inches . Nasal airflow  patent.  Retrognathia is not  seen.  Dental status: biological teeth , crowding of lower jaw.  Cardiovascular:  Regular rate and cardiac rhythm by pulse,   without distended neck veins. Respiratory: Lungs are clear to auscultation.  Skin:  Without evidence of ankle edema, or rash. Trunk: The patient's posture is erect.   Neurologic exam : The patient is awake and alert, oriented to place and time.   Memory subjective described as intact.  Attention span & concentration ability appears normal.  Speech is fluent,  without  dysarthria, dysphonia or aphasia.  Mood and affect are appropriate.   Cranial nerves: no loss of smell or taste reported  Pupils are equal and briskly reactive to light. Funduscopic exam deferred. .  Extraocular movements in vertical and horizontal planes were intact and without nystagmus. No Diplopia. Visual fields by finger perimetry are intact. Hearing was intact to soft voice and finger rubbing.    Facial sensation intact to fine touch.  Facial motor strength is symmetric and tongue and uvula move midline.  Neck ROM : rotation, tilt and flexion extension were normal for age and shoulder shrug was symmetrical.    Motor exam:  Symmetric bulk, tone and ROM.   Normal tone without cog wheeling, symmetric grip strength .   Sensory:  Right shoulder hurts , thumb hurts  elbow hurts.  Fine touch, pinprick and vibration were tested  and  normal.  Proprioception tested in the upper extremities was normal.   Coordination: Rapid alternating movements in the fingers/hands were of normal speed.  The Finger-to-nose maneuver was intact without evidence of ataxia, dysmetria or tremor.   Gait and station: Patient could rise unassisted from a seated position, walked without assistive device.  Stance is of normal width/ base and the patient turned with 3 steps.  Toe and heel walk were deferred.  Deep tendon reflexes: in the  upper and lower  extremities are symmetric and intact.  Babinski response was deferred.     Component Ref Range & Units 3 wk ago 7 mo ago  Glucose 70 - 99 mg/dL 94  98 R   BUN 6 - 24 mg/dL 14  13   Creatinine,  Ser 0.57 - 1.00 mg/dL 0.94  0.97   eGFR >59 mL/min/1.73 73  71   BUN/Creatinine Ratio 9 - _0 Sodium 134 - 144 mmol/L 143  138   Potassium 3.5 - 5.2 mmol/L 4.9  4.4   Chloride 96 - 106 mmol/L 104  103   CO2 20 - 29 mmol/L 23  20   Calcium 8.7 - 10.2 mg/dL 10.0  10.3 High    Total Protein 6.0 - 8.5 g/dL 7.0  7.5   Albumin 3.8 - 4.9 g/dL 4.8  4.7 R   Globulin, Total 1.5 - 4.5 g/dL 2.2  2.8   Albumin/Globulin Ratio 1.2 - 2.2 2.2  1.7   Bilirubin Total 0.0 - 1.2 mg/dL 0.3  0.6   Alkaline Phosphatase 44 - 121 IU/L 115  139 High    AST 0 - 40 IU/L 14  22   ALT 0 - 32 IU/L 10  27   Resulting Agency  [facility]       Narrative      Component Ref Range & Units 3 wk ago 7 mo ago  WBC 3.4 - 10.8 x10E3/uL 6.0  6.6   RBC 3.77 - 5.28 x10E6/uL 4.75  4.83   Hemoglobin 11.1 - 15.9 g/dL 13.2  13.8   Hematocrit 34.0 - 46.6 % 41.0  41.3   MCV 79 - 97 fL 86  86   MCH 26.6 - 33.0 pg 27.8  28.6   MCHC 31.5 - 35.7 g/dL 32.2  33.4   RDW 11.7 - 15.4 % 13.0  13.0   Platelets 150 - 450 x10E3/uL 315  351   Neutrophils Not Estab. % 62  61   Lymphs Not Estab. % 27  28   Monocytes Not Estab. % 6  6   Eos Not Estab. % 4  4   Basos Not Estab. % 1  1   Neutrophils Absolute 1.4 - 7.0 x10E3/uL 3.7  4.0   Lymphocytes Absolute 0.7 - 3.1 x10E3/uL 1.6  1.8   Monocytes Absolute 0.1 - 0.9 x10E3/uL 0.3  0.4   EOS (ABSOLUTE) 0.0 - 0.4 x10E3/uL 0.2  0.3   Basophils Absolute 0.0 - 0.2 x10E3/uL 0.1  0.1   Immature Granulocytes Not Estab. % 0  0   Immature Grans (Abs) 0.0 - 0.1 x10E3/uL 0.0  0.0   Resulting Agency  [facility]       Narrative Performed by: Maryan Puls Performed at:  [facility]  [address] [place], [place]  [id number]  Lab Director: Rush Farmer MD, Phone:  [phone]    Specimen Collected: 11/09/21 10:53 Last Resulted: 11/10/21 02:35      Lab Flowsheet    Order Details    View Encounter    Lab and Collection Details    Routing    Result History    View  Encounter Conversation        Result Care Coordination   Result Notes and Patient Communication  Edit Comments   Add Notifications  Back to [employer],    Your CBC looks great. No signs of infection or anemia present.   Marland Kitchen..  After spending a total time of  35  minutes face to face and additional time for physical and neurologic examination, review of laboratory studies,  personal review of imaging studies, reports and results of other testing and review of referral information / records as far as provided in visit, I have established the following assessments:  1) witnessed sleep apnea. EDS. Fragmented  nocturia.  Obesity is a valid risk factor as is upper airway restriction. She believes to be prediabetic.  2) excessive fatigue, worse since COVID.  3) non -restorative sleep- never feeling rested.  4) RLS, takes gabapentin at bedtime.    My Plan is to proceed with:  1) SPLIT at AHI 10 or HST. Patient concerned about being able to sleep in lab.  2) weight loss will help to reduce snoring.  3) the patient has some post viral fatigue.  4) insomnia is related to longstanding anxiety.   I would like to thank Early, Coralee Pesa, NP @ [address] [place],  [place] [address] for allowing me to meet with and to take care of this pleasant patient.   In short, Anne Kramer is presenting with sleep apnea and fatigue.  I plan to follow up either personally or through our NP within 2-4 month.   CC: I will share my notes with PCP.  Electronically signed by: Larey Seat, MD 12/06/2021 9:23 AM  [facility] Board certified by The [employer] of Sleep Medicine and Diplomate of the [employer] of Sleep Medicine. Board certified In Neurology through the [place], Fellow of the [employer] of Neurology. Medical Director of [facility].

## 2021-12-15 ENCOUNTER — Encounter: Payer: Self-pay | Admitting: Neurology

## 2021-12-17 ENCOUNTER — Other Ambulatory Visit (HOSPITAL_COMMUNITY): Payer: Self-pay

## 2021-12-21 ENCOUNTER — Other Ambulatory Visit (HOSPITAL_COMMUNITY): Payer: Self-pay

## 2021-12-22 ENCOUNTER — Other Ambulatory Visit (HOSPITAL_COMMUNITY): Payer: Self-pay

## 2022-01-12 ENCOUNTER — Other Ambulatory Visit (HOSPITAL_COMMUNITY): Payer: Self-pay

## 2022-01-20 ENCOUNTER — Telehealth: Payer: Self-pay

## 2022-01-20 NOTE — Telephone Encounter (Signed)
LVM for pt to call me back to schedule sleep study  

## 2022-01-21 ENCOUNTER — Other Ambulatory Visit (HOSPITAL_COMMUNITY): Payer: Self-pay

## 2022-01-25 ENCOUNTER — Telehealth: Payer: Self-pay

## 2022-01-25 NOTE — Telephone Encounter (Signed)
Returned pt's call and LVM for pt to call me back to schedule sleep study ° °

## 2022-02-17 ENCOUNTER — Telehealth: Payer: Self-pay

## 2022-02-17 NOTE — Telephone Encounter (Signed)
LVM for pt to call me back to schedule sleep study  

## 2022-02-23 ENCOUNTER — Other Ambulatory Visit (HOSPITAL_COMMUNITY): Payer: Self-pay

## 2022-02-23 MED ORDER — OZEMPIC (0.25 OR 0.5 MG/DOSE) 2 MG/3ML ~~LOC~~ SOPN
PEN_INJECTOR | SUBCUTANEOUS | 0 refills | Status: DC
  Filled 2022-02-23: qty 3, 28d supply, fill #0

## 2022-03-05 ENCOUNTER — Ambulatory Visit
Admission: EM | Admit: 2022-03-05 | Discharge: 2022-03-05 | Disposition: A | Discharge status: Home / Self Care | Payer: 59 | Attending: Family Medicine | Admitting: Family Medicine

## 2022-03-05 DIAGNOSIS — J3089 Other allergic rhinitis: Secondary | ICD-10-CM

## 2022-03-05 DIAGNOSIS — H66001 Acute suppurative otitis media without spontaneous rupture of ear drum, right ear: Secondary | ICD-10-CM

## 2022-03-05 DIAGNOSIS — J0100 Acute maxillary sinusitis, unspecified: Secondary | ICD-10-CM | POA: Diagnosis not present

## 2022-03-05 MED ORDER — PREDNISONE 20 MG PO TABS: 40.0000 mg | ORAL_TABLET | Freq: Every day | ORAL | 0 refills | Status: DC

## 2022-03-05 MED ORDER — AMOXICILLIN-POT CLAVULANATE 875-125 MG PO TABS: 1.0000 | ORAL_TABLET | Freq: Two times a day (BID) | ORAL | 0 refills | Status: DC

## 2022-03-05 NOTE — ED Triage Notes (Signed)
Pt states her right ear is hurting and sinus pressure for about a week ? ?Denies Fever ?

## 2022-03-05 NOTE — ED Provider Notes (Signed)
?[facility] ? ? ? ?CSN: [id number] ?Arrival date & time: 03/05/22  0818 ? ? ?  ? ?History   ?Chief Complaint ?Chief Complaint  ?Patient presents with  ? Otalgia  ?  Sinus issues and ear pain  ? ?HPI ?Anne Kramer is a 51 y.o. female.  ? ?Patient presenting today with progressively worsening sinus pain and pressure, now dental pain eating from sinuses, nasal congestion, intermittent cough and progressively worsening right ear pain, muffled hearing over the past several weeks.  She denies drainage from the ear, fever, chills, headache, chest pain, shortness of breath, abdominal pain, nausea vomiting or diarrhea.  Has been taking antihistamines, nasal sprays twice daily, Sudafed as needed, ibuprofen and Tylenol with minimal temporary relief of symptoms. ? ? ?Past Medical History:  ?Diagnosis Date  ? Abnormal mammogram   ? Abnormal Pap smear of cervix   ? Anxiety   ? Depression   ? Diabetes mellitus without complication ([place])   ? Hyperlipidemia   ? ? ?Patient Active Problem List  ? Diagnosis Date Noted  ? Non-restorative sleep 12/06/2021  ? Class 2 obesity due to excess calories without serious comorbidity with body mass index (BMI) of 36.0 to 36.9 in adult 12/06/2021  ? Snoring 12/06/2021  ? Nocturia more than twice per night 12/06/2021  ? Elbow pain, right 11/09/2021  ? Thumb pain, right 11/09/2021  ? Pre-diabetes 05/25/2021  ? RLS (restless legs syndrome) 05/06/2021  ? Encounter to establish care 05/06/2021  ? Anxiety and depression 03/12/2020  ? BMI 38.0-38.9,adult 03/12/2020  ? Gastroesophageal reflux disease without esophagitis 03/12/2020  ? Other hyperlipidemia 03/12/2020  ? ?Past Surgical History:  ?Procedure Laterality Date  ? ABDOMINAL HYSTERECTOMY    ? CHOLECYSTECTOMY    ? ?OB History   ?No obstetric history on file. ?  ? ? ?Home Medications   ? ?Prior to Admission medications   ?Medication Sig Start Date End Date Taking? Authorizing Provider  ?amoxicillin-clavulanate (AUGMENTIN) 875-125 MG  tablet Take 1 tablet by mouth every 12 (twelve) hours. 03/05/22  Yes Volney American, PA-C  ?predniSONE (DELTASONE) 20 MG tablet Take 2 tablets (40 mg total) by mouth daily with breakfast. 03/05/22  Yes Volney American, PA-C  ?atorvastatin (LIPITOR) 20 MG tablet Take 1 tablet (20 mg total) by mouth daily. 11/09/21   Orma Render, NP  ?blood glucose meter kit and supplies Dispense based on patient and insurance preference. Use up to four times daily as directed. (FOR ICD-10 R73.03, Z68.38) 05/25/21   Early, Coralee Pesa, NP  ?buPROPion (WELLBUTRIN) 100 MG tablet Take 1 tablet (100 mg total) by mouth daily. 05/06/21   Orma Render, NP  ?esomeprazole (NEXIUM) 40 MG capsule Take 1 capsule (40 mg total) by mouth daily. 09/09/21   de [place], Raymond J, MD  ?gabapentin (NEURONTIN) 100 MG capsule Take 1 capsule (100 mg total) by mouth 2 (two) times daily 8 hours apart as needed. Take 1-3  capsules at night for restless legs and anxiety. 05/06/21   Orma Render, NP  ?ondansetron (ZOFRAN) 4 MG tablet Take 1 tablet (4 mg total) by mouth every 8 (eight) hours as needed for nausea or vomiting. 11/09/21   Early, Coralee Pesa, NP  ?pramipexole (MIRAPEX) 0.125 MG tablet Take 1 tablet (0.125 mg total) by mouth at bedtime. 12/06/21   Dohmeier, Asencion Partridge, MD  ?Semaglutide,0.25 or 0.5MG/DOS, (OZEMPIC, 0.25 OR 0.5 MG/DOSE,) 2 MG/3ML SOPN Inject 0.5 mg under the skin once weekly. 11/09/21   Jacolyn Reedy  E, NP  ?Semaglutide,0.25 or 0.5MG/DOS, 2 MG/1.5ML SOPN Inject 0.56m once a week for 4 weeks THEN increase to 0.526monce a week. 11/09/21   EaOrma RenderNP  ?venlafaxine XR (EFFEXOR-XR) 150 MG 24 hr capsule Take 1 capsule (150 mg total) by mouth daily with breakfast. 06/18/21   Early, SaCoralee PesaNP  ? ? ?Family History ?Family History  ?Problem Relation Age of Onset  ? High blood pressure Mother   ? High Cholesterol Mother   ? Depression Mother   ? Anxiety disorder Mother   ? Atrial fibrillation Mother   ? Thyroid disease Mother   ? High blood pressure  Father   ? Heart disease Father   ? High Cholesterol Sister   ? Cancer Sister   ? Breast cancer Maternal Aunt   ? Breast cancer Paternal Grandmother   ? ?Social History ?Social History  ? ?Tobacco Use  ? Smoking status: Never  ? Smokeless tobacco: Never  ?Vaping Use  ? Vaping Use: Never used  ?Substance Use Topics  ? Alcohol use: Yes  ?  Comment: once a month  ? Drug use: Never  ? ? ? ?Allergies   ?Kiwi extract ? ? ?Review of Systems ?Review of Systems ?PER HPI ? ?Physical Exam ?Triage Vital Signs ?ED Triage Vitals  ?Enc Vitals Group  ?   BP 03/05/22 0822 122/87  ?   Pulse Rate 03/05/22 0822 93  ?   Resp 03/05/22 0822 18  ?   Temp 03/05/22 0822 98 ?F (36.7 ?C)  ?   Temp Source 03/05/22 0822 Oral  ?   SpO2 03/05/22 0822 97 %  ?   Weight --   ?   Height --   ?   Head Circumference --   ?   Peak Flow --   ?   Pain Score 03/05/22 0824 0  ?   Pain Loc --   ?   Pain Edu? --   ?   Excl. in [place]--   ? ?No data found. ? ?Updated Vital Signs ?BP 122/87 (BP Location: Right Arm)   Pulse 93   Temp 98 ?F (36.7 ?C) (Oral)   Resp 18   SpO2 97%  ? ?Visual Acuity ?Right Eye Distance:   ?Left Eye Distance:   ?Bilateral Distance:   ? ?Right Eye Near:   ?Left Eye Near:    ?Bilateral Near:    ? ?Physical Exam ?Vitals and nursing note reviewed.  ?Constitutional:   ?   Appearance: Normal appearance. She is not ill-appearing.  ?HENT:  ?   Head: Atraumatic.  ?   Ears:  ?   Comments: Bilateral middle ear effusion.  Right TM erythematous, edematous ?   Nose: Congestion and rhinorrhea present.  ?   Mouth/Throat:  ?   Mouth: Mucous membranes are moist.  ?   Pharynx: Posterior oropharyngeal erythema present. No oropharyngeal exudate.  ?Eyes:  ?   Extraocular Movements: Extraocular movements intact.  ?   Conjunctiva/sclera: Conjunctivae normal.  ?Cardiovascular:  ?   Rate and Rhythm: Normal rate and regular rhythm.  ?   Heart sounds: Normal heart sounds.  ?Pulmonary:  ?   Effort: Pulmonary effort is normal.  ?   Breath sounds: Normal breath  sounds. No wheezing or rales.  ?Musculoskeletal:     ?   General: Normal range of motion.  ?   Cervical back: Normal range of motion and neck supple.  ?Skin: ?   General: Skin is  warm and dry.  ?Neurological:  ?   Mental Status: She is alert and oriented to person, place, and time.  ?Psychiatric:     ?   Mood and Affect: Mood normal.     ?   Thought Content: Thought content normal.     ?   Judgment: Judgment normal.  ? ? ? ?UC Treatments / Results  ?Labs ?(all labs ordered are listed, but only abnormal results are displayed) ?Labs Reviewed - No data to display ? ?EKG ? ? ?Radiology ?No results found. ? ?Procedures ?Procedures (including critical care time) ? ?Medications Ordered in UC ?Medications - No data to display ? ?Initial Impression / Assessment and Plan / UC Course  ?I have reviewed the triage vital signs and the nursing notes. ? ?Pertinent labs & imaging results that were available during my care of the patient were reviewed by me and considered in my medical decision making (see chart for details). ? ?  ? ?Suspect initially symptoms were related to seasonal allergies, now with exacerbation into sinusitis and right otitis media.  Treat with prednisone, Augmentin, continued allergy regimen and supportive medications and home care.  Return for acutely worsening symptoms. ? ?Final Clinical Impressions(s) / UC Diagnoses  ? ?Final diagnoses:  ?Acute non-recurrent maxillary sinusitis  ?Acute suppurative otitis media of right ear without spontaneous rupture of tympanic membrane, recurrence not specified  ?Seasonal allergic rhinitis due to other allergic trigger  ? ?Discharge Instructions   ?None ?  ? ?ED Prescriptions   ? ? Medication Sig Dispense Auth. Provider  ? predniSONE (DELTASONE) 20 MG tablet Take 2 tablets (40 mg total) by mouth daily with breakfast. 10 tablet Volney American, PA-C  ? amoxicillin-clavulanate (AUGMENTIN) 875-125 MG tablet Take 1 tablet by mouth every 12 (twelve) hours. 14 tablet  Volney American, [place]  ? ?  ? ?PDMP not reviewed this encounter. ?  ?Volney American, PA-C ?03/05/22 1527 ? ?

## 2022-03-09 ENCOUNTER — Other Ambulatory Visit (HOSPITAL_COMMUNITY): Payer: Self-pay

## 2022-03-22 ENCOUNTER — Other Ambulatory Visit (HOSPITAL_COMMUNITY): Payer: Self-pay | Admitting: Nurse Practitioner

## 2022-03-22 DIAGNOSIS — Z1231 Encounter for screening mammogram for malignant neoplasm of breast: Secondary | ICD-10-CM

## 2022-03-25 ENCOUNTER — Other Ambulatory Visit (HOSPITAL_COMMUNITY): Payer: Self-pay

## 2022-03-25 MED ORDER — OZEMPIC (0.25 OR 0.5 MG/DOSE) 2 MG/3ML ~~LOC~~ SOPN
PEN_INJECTOR | SUBCUTANEOUS | 0 refills | Status: DC
  Filled 2022-03-25: qty 3, 28d supply, fill #0

## 2022-03-31 ENCOUNTER — Encounter (HOSPITAL_COMMUNITY): Payer: 59

## 2022-03-31 DIAGNOSIS — Z1231 Encounter for screening mammogram for malignant neoplasm of breast: Secondary | ICD-10-CM

## 2022-04-14 ENCOUNTER — Other Ambulatory Visit (HOSPITAL_BASED_OUTPATIENT_CLINIC_OR_DEPARTMENT_OTHER): Payer: Self-pay | Admitting: Nurse Practitioner

## 2022-04-15 ENCOUNTER — Other Ambulatory Visit (HOSPITAL_COMMUNITY): Payer: Self-pay

## 2022-04-15 MED ORDER — OZEMPIC (0.25 OR 0.5 MG/DOSE) 2 MG/3ML ~~LOC~~ SOPN
PEN_INJECTOR | SUBCUTANEOUS | 0 refills | Status: DC
  Filled 2022-04-15: qty 3, 28d supply, fill #0

## 2022-04-16 ENCOUNTER — Other Ambulatory Visit (HOSPITAL_COMMUNITY): Payer: Self-pay

## 2022-04-18 ENCOUNTER — Other Ambulatory Visit (HOSPITAL_COMMUNITY): Payer: Self-pay | Admitting: Physician Assistant

## 2022-04-18 ENCOUNTER — Other Ambulatory Visit: Payer: Self-pay | Admitting: Physician Assistant

## 2022-04-18 DIAGNOSIS — E010 Iodine-deficiency related diffuse (endemic) goiter: Secondary | ICD-10-CM | POA: Diagnosis not present

## 2022-04-18 DIAGNOSIS — T161XXA Foreign body in right ear, initial encounter: Secondary | ICD-10-CM | POA: Diagnosis not present

## 2022-04-27 ENCOUNTER — Ambulatory Visit (HOSPITAL_COMMUNITY): Payer: 59

## 2022-04-28 ENCOUNTER — Ambulatory Visit (HOSPITAL_COMMUNITY)
Admission: RE | Admit: 2022-04-28 | Discharge: 2022-04-28 | Disposition: A | Discharge status: Home / Self Care | Payer: 59 | Source: Ambulatory Visit | Attending: Nurse Practitioner | Admitting: Nurse Practitioner

## 2022-04-28 ENCOUNTER — Ambulatory Visit (HOSPITAL_COMMUNITY)
Admission: RE | Admit: 2022-04-28 | Discharge: 2022-04-28 | Disposition: A | Discharge status: Home / Self Care | Payer: 59 | Source: Ambulatory Visit | Attending: Physician Assistant | Admitting: Physician Assistant

## 2022-04-28 DIAGNOSIS — E010 Iodine-deficiency related diffuse (endemic) goiter: Secondary | ICD-10-CM | POA: Insufficient documentation

## 2022-04-28 DIAGNOSIS — E042 Nontoxic multinodular goiter: Secondary | ICD-10-CM | POA: Diagnosis not present

## 2022-04-28 DIAGNOSIS — Z1231 Encounter for screening mammogram for malignant neoplasm of breast: Secondary | ICD-10-CM | POA: Insufficient documentation

## 2022-05-06 ENCOUNTER — Encounter (HOSPITAL_BASED_OUTPATIENT_CLINIC_OR_DEPARTMENT_OTHER): Payer: 59 | Admitting: Nurse Practitioner

## 2022-05-09 ENCOUNTER — Encounter (HOSPITAL_BASED_OUTPATIENT_CLINIC_OR_DEPARTMENT_OTHER): Payer: 59 | Admitting: Nurse Practitioner

## 2022-05-10 ENCOUNTER — Other Ambulatory Visit: Payer: Self-pay | Admitting: Physician Assistant

## 2022-05-10 DIAGNOSIS — E042 Nontoxic multinodular goiter: Secondary | ICD-10-CM

## 2022-05-12 ENCOUNTER — Other Ambulatory Visit (HOSPITAL_COMMUNITY): Payer: Self-pay

## 2022-05-12 ENCOUNTER — Encounter (HOSPITAL_BASED_OUTPATIENT_CLINIC_OR_DEPARTMENT_OTHER): Payer: Self-pay | Admitting: Nurse Practitioner

## 2022-05-12 ENCOUNTER — Ambulatory Visit (INDEPENDENT_AMBULATORY_CARE_PROVIDER_SITE_OTHER): Payer: 59 | Admitting: Nurse Practitioner

## 2022-05-12 VITALS — BP 122/93 | HR 97 | Temp 97.6°F | Ht 65.0 in | Wt 202.0 lb

## 2022-05-12 DIAGNOSIS — Z23 Encounter for immunization: Secondary | ICD-10-CM | POA: Diagnosis not present

## 2022-05-12 DIAGNOSIS — E119 Type 2 diabetes mellitus without complications: Secondary | ICD-10-CM | POA: Diagnosis not present

## 2022-05-12 DIAGNOSIS — F32A Depression, unspecified: Secondary | ICD-10-CM

## 2022-05-12 DIAGNOSIS — F419 Anxiety disorder, unspecified: Secondary | ICD-10-CM

## 2022-05-12 DIAGNOSIS — Z0000 Encounter for general adult medical examination without abnormal findings: Secondary | ICD-10-CM | POA: Diagnosis not present

## 2022-05-12 DIAGNOSIS — E7849 Other hyperlipidemia: Secondary | ICD-10-CM | POA: Diagnosis not present

## 2022-05-12 DIAGNOSIS — Z90710 Acquired absence of both cervix and uterus: Secondary | ICD-10-CM | POA: Diagnosis not present

## 2022-05-12 DIAGNOSIS — E010 Iodine-deficiency related diffuse (endemic) goiter: Secondary | ICD-10-CM | POA: Diagnosis not present

## 2022-05-12 DIAGNOSIS — G2581 Restless legs syndrome: Secondary | ICD-10-CM | POA: Diagnosis not present

## 2022-05-12 MED ORDER — BUPROPION HCL 100 MG PO TABS
100.0000 mg | ORAL_TABLET | Freq: Every day | ORAL | 3 refills | Status: DC
  Filled 2022-05-12: qty 90, 90d supply, fill #0
  Filled 2022-08-29: qty 90, 90d supply, fill #1
  Filled 2022-11-23: qty 90, 90d supply, fill #2
  Filled 2023-02-21: qty 90, 90d supply, fill #3

## 2022-05-12 MED ORDER — OZEMPIC (0.25 OR 0.5 MG/DOSE) 2 MG/3ML ~~LOC~~ SOPN
PEN_INJECTOR | SUBCUTANEOUS | 6 refills | Status: DC
  Filled 2022-05-12: qty 3, 28d supply, fill #0
  Filled 2022-06-10: qty 3, 28d supply, fill #1
  Filled 2022-07-06: qty 3, 28d supply, fill #2
  Filled 2022-08-05: qty 3, 28d supply, fill #3
  Filled 2022-08-29: qty 3, 42d supply, fill #4
  Filled 2022-10-12: qty 3, 28d supply, fill #5
  Filled 2022-11-07: qty 3, 28d supply, fill #6

## 2022-05-12 MED ORDER — ATORVASTATIN CALCIUM 20 MG PO TABS
20.0000 mg | ORAL_TABLET | Freq: Every day | ORAL | 3 refills | Status: DC
  Filled 2022-05-12 – 2022-06-10 (×2): qty 90, 90d supply, fill #0
  Filled 2022-09-08: qty 90, 90d supply, fill #1
  Filled 2022-12-03: qty 90, 90d supply, fill #2
  Filled 2023-03-06: qty 90, 90d supply, fill #3

## 2022-05-12 MED ORDER — GABAPENTIN 100 MG PO CAPS
100.0000 mg | ORAL_CAPSULE | Freq: Every day | ORAL | 3 refills | Status: DC
  Filled 2022-05-12 – 2022-05-31 (×2): qty 270, 90d supply, fill #0
  Filled 2022-08-29: qty 270, 90d supply, fill #1
  Filled 2022-11-23: qty 270, 90d supply, fill #2
  Filled 2023-02-21: qty 270, 90d supply, fill #3

## 2022-05-12 MED ORDER — ZOSTER VAC RECOMB ADJUVANTED 50 MCG/0.5ML IM SUSR: 0.5000 mL | Freq: Once | INTRAMUSCULAR | 1 refills | Status: AC

## 2022-05-12 MED ORDER — VENLAFAXINE HCL ER 150 MG PO CP24
150.0000 mg | ORAL_CAPSULE | Freq: Every day | ORAL | 3 refills | Status: DC
  Filled 2022-05-12 – 2022-06-18 (×2): qty 90, 90d supply, fill #0
  Filled 2022-09-15: qty 90, 90d supply, fill #1
  Filled 2022-12-15: qty 90, 90d supply, fill #2
  Filled 2023-03-13: qty 90, 90d supply, fill #3

## 2022-05-12 NOTE — Progress Notes (Signed)
BP (!) 122/93   Pulse 97   Temp 97.6 F (36.4 C) (Oral)   Ht 5' 5"  (1.651 m)   Wt 202 lb (91.6 kg)   SpO2 99%   BMI 33.61 kg/m    Subjective:    Patient ID: Anne Kramer, female    DOB: 1971-10-10, 51 y.o.   MRN: [id number] Tuesday biopsy on thyroid  HPI: Anne Kramer is a 51 y.o. female presenting on 05/12/2022 for comprehensive medical examination.  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 She has had no vaginal bleeding. She denies concerning menopausal symptoms She is currently sexually active  She denies concerns today about STI  She denies concerns about skin changes today She denies concerns about bowel changes today  She denies concerns about bladder changes today   Most Recent Depression Screen:     05/12/2022   10:00 AM 05/06/2021   10:06 AM  Depression screen PHQ 2/9  Decreased Interest 0 1  Down, Depressed, Hopeless 0 2  PHQ - 2 Score 0 3  Altered sleeping  3  Tired, decreased energy  3  Change in appetite  3  Feeling bad or failure about yourself   2  Trouble concentrating  1  Moving slowly or fidgety/restless  0  Suicidal thoughts  1  PHQ-9 Score  16  Difficult doing work/chores  Somewhat difficult   Most Recent Anxiety Screen:     05/06/2021   10:07 AM  GAD 7 : Generalized Anxiety Score  Nervous, Anxious, on Edge 2  Control/stop worrying 2  Worry too much - different things 1  Trouble relaxing 1  Restless 1  Easily annoyed or irritable 1  Afraid - awful might happen 0  Total GAD 7 Score 8  Anxiety  Difficulty Somewhat difficult   Most Recent Fall Screen:    05/12/2022   10:00 AM 05/06/2021   10:05 AM  Fall Risk   Falls in the past year? 0 1  Number falls in past yr: 0 0  Injury with Fall? 0 1  Risk for fall due to : No Fall Risks No Fall Risks  Follow up Falls evaluation completed Falls evaluation completed    All ROS negative except what is listed above and in the HPI.   Past medical history, surgical history, medications, allergies, family history and social history reviewed with patient today and changes made to appropriate areas of the chart.  Past Medical History:  Past Medical History:  Diagnosis Date   Abnormal mammogram    Abnormal Pap smear of cervix    Anxiety    Depression    Diabetes mellitus without complication ([facility]    Hyperlipidemia    Medications:  Current Outpatient Medications on File Prior to Visit  Medication Sig   blood glucose meter kit and supplies Dispense based on patient and insurance preference. Use up to four times daily as directed. (FOR ICD-10 R73.03, Z68.38)   esomeprazole (NEXIUM) 40 MG capsule Take 1 capsule (40 mg total) by mouth daily.  ondansetron (ZOFRAN) 4 MG tablet Take 1 tablet (4 mg total) by mouth every 8 (eight) hours as needed for nausea or vomiting.   pramipexole (MIRAPEX) 0.125 MG tablet Take 1 tablet (0.125 mg total) by mouth at bedtime.   No current facility-administered medications on file prior to visit.   Surgical History:  Past Surgical History:  Procedure Laterality Date   ABDOMINAL HYSTERECTOMY     CHOLECYSTECTOMY     Allergies:  Allergies  Allergen Reactions   Kiwi Extract Swelling    Makes lips swell   Social History:  Social History   Socioeconomic History   Marital status: Married    Spouse name: Roselyn Reef   Number of children: 2   Years of education: Not on file   Highest education level: Associate degree: occupational, Hotel manager, or vocational program  Occupational History   Not on file  Tobacco  Use   Smoking status: Never   Smokeless tobacco: Never  Vaping Use   Vaping Use: Never used  Substance and Sexual Activity   Alcohol use: Yes    Comment: once a month   Drug use: Never   Sexual activity: Yes    Birth control/protection: Surgical  Other Topics Concern   Not on file  Social History Narrative   Lives at home with husband   Right handed   Caffeine: 4 caffeine drinks a day   Social Determinants of Radio broadcast assistant Strain: Not on file  Food Insecurity: Not on file  Transportation Needs: Not on file  Physical Activity: Not on file  Stress: Not on file  Social Connections: Not on file  Intimate Partner Violence: Not on file   Social History   Tobacco Use  Smoking Status Never  Smokeless Tobacco Never   Social History   Substance and Sexual Activity  Alcohol Use Yes   Comment: once a month   Family History:  Family History  Problem Relation Age of Onset   High blood pressure Mother    High Cholesterol Mother    Depression Mother    Anxiety disorder Mother    Atrial fibrillation Mother    Thyroid disease Mother    High blood pressure Father    Heart disease Father    High Cholesterol Sister    Cancer Sister    Breast cancer Maternal Aunt    Breast cancer Paternal Grandmother        Objective:    BP (!) 122/93   Pulse 97   Temp 97.6 F (36.4 C) (Oral)   Ht 5' 5"  (1.651 m)   Wt 202 lb (91.6 kg)   SpO2 99%   BMI 33.61 kg/m   Wt Readings from Last 3 Encounters:  05/12/22 202 lb (91.6 kg)  12/06/21 221 lb 8 oz (100.5 kg)  11/09/21 228 lb 14.4 oz (103.8 kg)    Physical Exam Vitals and nursing note reviewed.  Constitutional:      General: She is not in acute distress.    Appearance: Normal appearance.  HENT:     Head: Normocephalic and atraumatic.     Right Ear: Hearing, tympanic membrane, ear canal and external ear normal.     Left Ear: Hearing, tympanic membrane, ear canal and external ear normal.     Nose: Nose normal.      Right Sinus: No maxillary sinus tenderness or frontal sinus tenderness.     Left Sinus: No maxillary sinus tenderness or frontal sinus tenderness.     Mouth/Throat:  Lips: Pink.     Mouth: Mucous membranes are moist.     Pharynx: Oropharynx is clear.  Eyes:     General: Lids are normal. Vision grossly intact.     Extraocular Movements: Extraocular movements intact.     Conjunctiva/sclera: Conjunctivae normal.     Pupils: Pupils are equal, round, and reactive to light.     Funduscopic exam:    Right eye: Red reflex present.        Left eye: Red reflex present.    Visual Fields: Right eye visual fields normal and left eye visual fields normal.  Neck:     Thyroid: No thyromegaly.     Vascular: No carotid bruit.  Cardiovascular:     Rate and Rhythm: Normal rate and regular rhythm.     Chest Wall: PMI is not displaced.     Pulses: Normal pulses.          Dorsalis pedis pulses are 2+ on the right side and 2+ on the left side.       Posterior tibial pulses are 2+ on the right side and 2+ on the left side.     Heart sounds: Normal heart sounds. No murmur heard. Pulmonary:     Effort: Pulmonary effort is normal. No respiratory distress.     Breath sounds: Normal breath sounds.  Abdominal:     General: Abdomen is flat. Bowel sounds are normal. There is no distension.     Palpations: Abdomen is soft. There is no hepatomegaly, splenomegaly or mass.     Tenderness: There is no abdominal tenderness. There is no right CVA tenderness, left CVA tenderness, guarding or rebound.  Musculoskeletal:        General: Normal range of motion.     Cervical back: Full passive range of motion without pain, normal range of motion and neck supple. No tenderness.     Right lower leg: No edema.     Left lower leg: No edema.  Feet:     Left foot:     Toenail Condition: Left toenails are normal.  Lymphadenopathy:     Cervical: No cervical adenopathy.     Upper Body:     Right upper body: No  supraclavicular adenopathy.     Left upper body: No supraclavicular adenopathy.  Skin:    General: Skin is warm and dry.     Capillary Refill: Capillary refill takes less than 2 seconds.     Nails: There is no clubbing.  Neurological:     General: No focal deficit present.     Mental Status: She is alert and oriented to person, place, and time.     GCS: GCS eye subscore is 4. GCS verbal subscore is 5. GCS motor subscore is 6.     Sensory: Sensation is intact.     Motor: Motor function is intact.     Coordination: Coordination is intact.     Gait: Gait is intact.     Deep Tendon Reflexes: Reflexes are normal and symmetric.  Psychiatric:        Attention and Perception: Attention normal.        Mood and Affect: Mood normal.        Speech: Speech normal.        Behavior: Behavior normal. Behavior is cooperative.        Thought Content: Thought content normal.        Cognition and Memory: Cognition and memory normal.  Judgment: Judgment normal.     Results for orders placed or performed in visit on 05/12/22  CBC With Diff/Platelet  Result Value Ref Range   WBC 6.6 3.4 - 10.8 x10E3/uL   RBC 4.50 3.77 - 5.28 x10E6/uL   Hemoglobin 13.2 11.1 - 15.9 g/dL   Hematocrit 38.9 34.0 - 46.6 %   MCV 86 79 - 97 fL   MCH 29.3 26.6 - 33.0 pg   MCHC 33.9 31.5 - 35.7 g/dL   RDW 12.9 11.7 - 15.4 %   Platelets 342 150 - 450 x10E3/uL   Neutrophils 65 Not Estab. %   Lymphs 25 Not Estab. %   Monocytes 6 Not Estab. %   Eos 3 Not Estab. %   Basos 1 Not Estab. %   Neutrophils Absolute 4.3 1.4 - 7.0 x10E3/uL   Lymphocytes Absolute 1.7 0.7 - 3.1 x10E3/uL   Monocytes Absolute 0.4 0.1 - 0.9 x10E3/uL   EOS (ABSOLUTE) 0.2 0.0 - 0.4 x10E3/uL   Basophils Absolute 0.1 0.0 - 0.2 x10E3/uL   Immature Granulocytes 0 Not Estab. %   Immature Grans (Abs) 0.0 0.0 - 0.1 x10E3/uL  Comprehensive metabolic panel  Result Value Ref Range   Glucose 84 70 - 99 mg/dL   BUN 10 6 - 24 mg/dL   Creatinine, Ser 1.00  0.57 - 1.00 mg/dL   eGFR 68 >59 mL/min/1.73   BUN/Creatinine Ratio 10 9 - 23   Sodium 139 134 - 144 mmol/L   Potassium 4.3 3.5 - 5.2 mmol/L   Chloride 101 96 - 106 mmol/L   CO2 21 20 - 29 mmol/L   Calcium 10.0 8.7 - 10.2 mg/dL   Total Protein 7.2 6.0 - 8.5 g/dL   Albumin 4.7 3.8 - 4.9 g/dL   Globulin, Total 2.5 1.5 - 4.5 g/dL   Albumin/Globulin Ratio 1.9 1.2 - 2.2   Bilirubin Total 0.4 0.0 - 1.2 mg/dL   Alkaline Phosphatase 113 44 - 121 IU/L   AST 13 0 - 40 IU/L   ALT 14 0 - 32 IU/L  Hemoglobin A1c  Result Value Ref Range   Hgb A1c MFr Bld 5.3 4.8 - 5.6 %   Est. average glucose Bld gHb Est-mCnc 105 mg/dL  VITAMIN D 25 Hydroxy (Vit-D Deficiency, Fractures)  Result Value Ref Range   Vit D, 25-Hydroxy 37.2 30.0 - 100.0 ng/mL  Thyroid Panel With TSH  Result Value Ref Range   TSH 0.765 0.450 - 4.500 uIU/mL   T4, Total 8.3 4.5 - 12.0 ug/dL   T3 Uptake Ratio 25 24 - 39 %   Free Thyroxine Index 2.1 1.2 - 4.9  Lipid panel  Result Value Ref Range   Cholesterol, Total 171 100 - 199 mg/dL   Triglycerides 130 0 - 149 mg/dL   HDL 51 >39 mg/dL   VLDL Cholesterol Cal 23 5 - 40 mg/dL   LDL Chol Calc (NIH) 97 0 - 99 mg/dL   Chol/HDL Ratio 3.4 0.0 - 4.4 ratio      Assessment & Plan:   Problem List Items Addressed This Visit     Thyromegaly   RLS (restless legs syndrome)   Relevant Medications   gabapentin (NEURONTIN) 100 MG capsule   Other hyperlipidemia   Relevant Medications   atorvastatin (LIPITOR) 20 MG tablet   Anxiety and depression   Relevant Medications   venlafaxine XR (EFFEXOR-XR) 150 MG 24 hr capsule   buPROPion (WELLBUTRIN) 100 MG tablet   gabapentin (NEURONTIN) 100 MG capsule  Other Visit Diagnoses     S/P TAH (total abdominal hysterectomy)    -  Primary   Routine general medical examination at a health care facility       Relevant Orders   CBC With Diff/Platelet (Completed)   Comprehensive metabolic panel (Completed)   Hemoglobin A1c (Completed)   VITAMIN D  25 Hydroxy (Vit-D Deficiency, Fractures) (Completed)   Thyroid Panel With TSH (Completed)   Lipid panel (Completed)   Need for shingles vaccine       Controlled type 2 diabetes mellitus without complication, without long-term current use of insulin (HCC)       Relevant Medications   Semaglutide,0.25 or 0.5MG/DOS, (OZEMPIC, 0.25 OR 0.5 MG/DOSE,) 2 MG/3ML SOPN   atorvastatin (LIPITOR) 20 MG tablet         IMMUNIZATIONS:   - Tdap: Tetanus vaccination status reviewed: last tetanus booster within 10 years. - Influenza: Postponed to flu season - Pneumovax: Not applicable - Prevnar: Not applicable - HPV: Not applicable - Zostavax vaccine: Not applicable  SCREENING: - Pap smear: done elsewhere - STI testing: deferred -Mammogram: Up to date  - Colonoscopy: Up to date  - Bone Density: Not applicable  -Hearing Test: Not applicable  -Spirometry: Not applicable   Follow up plan: Return in 1 year (on 05/13/2023) for CPE.  NEXT PREVENTATIVE PHYSICAL DUE IN 1 YEAR.  PATIENT COUNSELING PROVIDED:   For all adult patients, I recommend   A well balanced diet low in saturated fats, cholesterol, and moderation in carbohydrates.   This can be as simple as monitoring portion sizes and cutting back on sugary beverages such as soda and juice to start with.    Daily water consumption of at least 64 ounces.  Physical activity at least 180 minutes per week, if just starting out.   This can be as simple as taking the stairs instead of the elevator and walking 2-3 laps around the office  purposefully every day.   STD protection, partner selection, and regular testing if high risk.  Limited consumption of alcoholic beverages if alcohol is consumed.  For women, I recommend no more than 7 alcoholic beverages per week, spread out throughout the week.  Avoid "binge" drinking or consuming large quantities of alcohol in one setting.   Please let me know if you feel you may need help with reduction or  quitting alcohol consumption.   Avoidance of nicotine, if used.  Please let me know if you feel you may need help with reduction or quitting nicotine use.   Daily mental health attention.  This can be in the form of 5 minute daily meditation, prayer, journaling, yoga, reflection, etc.   Purposeful attention to your emotions and mental state can significantly improve your overall wellbeing and Health.  Please know that I am here to help you with all of your health care goals and am happy to work with you to find a solution that works best for you.  The greatest advice I have received with any changes in life are to take it one step at a time, that even means if all you can focus on is the next 60 seconds, then do that and celebrate your victories.  With any changes in life, you will have set backs, and that is OK. The important thing to remember is, if you have a set back, it is not a failure, it is an opportunity to try again!  Health Maintenance Recommendations Screening Testing Mammogram Every 1 -2 years  based on history and risk factors Starting at age 575 Pap Smear Ages 21-39 every 3 years Ages 54-65 every 5 years with HPV testing More frequent testing may be required based on results and history Colon Cancer Screening Every 1-10 years based on test performed, risk factors, and history Starting at age 74 Bone Density Screening Every 2-10 years based on history Starting at age 88 for women Recommendations for men differ based on medication usage, history, and risk factors AAA Screening One time ultrasound Men 38-108 years old who have every smoked Lung Cancer Screening Low Dose Lung CT every 12 months Age 64-80 years with a 30 pack-year smoking history who still smoke or who have quit within the last 15 years  Screening Labs Routine  Labs: Complete Blood Count (CBC), Complete Metabolic Panel (CMP), Cholesterol (Lipid Panel) Every 6-12 months based on history and  medications May be recommended more frequently based on current conditions or previous results Hemoglobin A1c Lab Every 3-12 months based on history and previous results Starting at age 57 or earlier with diagnosis of diabetes, high cholesterol, BMI >26, and/or risk factors Frequent monitoring for patients with diabetes to ensure blood sugar control Thyroid Panel (TSH w/ T3 & T4) Every 6 months based on history, symptoms, and risk factors May be repeated more often if on medication HIV One time testing for all patients 33 and older May be repeated more frequently for patients with increased risk factors or exposure Hepatitis C One time testing for all patients 35 and older May be repeated more frequently for patients with increased risk factors or exposure Gonorrhea, Chlamydia Every 12 months for all sexually active persons 13-24 years Additional monitoring may be recommended for those who are considered high risk or who have symptoms PSA Men 25-53 years old with risk factors Additional screening may be recommended from age 570-69 based on risk factors, symptoms, and history  Vaccine Recommendations Tetanus Booster All adults every 10 years Flu Vaccine All patients 6 months and older every year COVID Vaccine All patients 12 years and older Initial dosing with booster May recommend additional booster based on age and health history HPV Vaccine 2 doses all patients age 57-26 Dosing may be considered for patients over 26 Shingles Vaccine (Shingrix) 2 doses all adults 84 years and older Pneumonia (Pneumovax 23) All adults 29 years and older May recommend earlier dosing based on health history Pneumonia (Prevnar 4) All adults 64 years and older Dosed 1 year after Pneumovax 23  Additional Screening, Testing, and Vaccinations may be recommended on an individualized basis based on family history, health history, risk factors, and/or exposure.

## 2022-05-12 NOTE — Patient Instructions (Addendum)
I have ordered labs today. I will review these once the results have come back and will be in contact with you with my interpretation and recommendations. You will see the labs immediately, it may take several days for all of the labs to result and for me to review them. If you have any concerns- please dont hesitate to reach out Fat and Cholesterol Restricted Eating Plan Eating a diet that limits fat and cholesterol may help lower your risk for heart disease and other conditions. Your body needs fat and cholesterol for basic functions, but eating too much of these things can be harmful to your health. Your health care provider may order lab tests to check your blood fat (lipid) and cholesterol levels. This helps your health care provider understand your risk for certain conditions and whether you need to make diet changes. Work with your health care provider or dietitian to make an eating plan that is right for you. What are tips for following this plan? General guidelines If you are overweight, work with your health care provider to lose weight safely. Losing just 5-10% of your body weight can improve your overall health and help prevent diseases such as diabetes and heart disease. Avoid: Foods with added sugar. Fried foods. Foods that contain partially hydrogenated oils, including stick margarine, some tub margarines, cookies, crackers, and other baked goods. If you drink alcohol: Limit how much you have to: 0-1 drink a day for women who are not pregnant. 0-2 drinks a day for men. Know how much alcohol is in a drink. In the [place] one drink equals one 12 oz bottle of beer (355 mL), one 5 oz glass of wine (148 mL), or one 1 oz glass of hard liquor (44 mL). Reading food labels Check food labels for: Trans fats or partially hydrogenated oils. Avoid foods that contain these. High amounts of saturated fat. Choose foods that are low in saturated fat (less than 2 g). The amount of cholesterol in  each serving. The amount of fiber in each serving. Choose foods with healthy fats, such as: Monounsaturated and polyunsaturated fats. These include olive and canola oil, flaxseeds, walnuts, almonds, and seeds. Omega-3 fats. These are found in foods such as salmon, mackerel, sardines, tuna, flaxseed oil, and ground flaxseeds. Choose grain products that have whole grains. Look for the word "whole" as the first word in the ingredient list. Cooking Cook foods using methods other than frying. Baking, boiling, grilling, and broiling are some healthy options. Eat more home-cooked food and less restaurant, buffet, and fast food. Avoid cooking using saturated fats. Animal sources of saturated fats include meats, butter, and cream. Plant sources of saturated fats include palm oil, palm kernel oil, and coconut oil. Meal planning  At meals, imagine dividing your plate into fourths: Fill one-half of your plate with vegetables, green salads, and fruit. Fill one-fourth of your plate with whole grains. Fill one-fourth of your plate with lean protein foods. Eat fish that is high in omega-3 fats at least two times a week. Eat more foods that contain fiber, such as whole grains, beans, apples, pears, berries, broccoli, carrots, peas, and barley. These foods help promote healthy cholesterol levels in the blood. What foods should I eat? Fruits All fresh, canned (in natural juice), or frozen fruits. Vegetables Fresh or frozen vegetables (raw, steamed, roasted, or grilled). Green salads. Grains Whole grains, such as whole wheat or whole grain breads, crackers, cereals, and pasta. Unsweetened oatmeal, bulgur, barley, quinoa, or brown rice. Corn  or whole wheat flour tortillas. Meats and other proteins Ground beef (85% or leaner), grass-fed beef, or beef trimmed of fat. Skinless chicken or [place]. Ground chicken or [place]. Pork trimmed of fat. All fish and seafood. Egg whites. Dried beans, peas, or lentils.  Unsalted nuts or seeds. Unsalted canned beans. Natural nut butters without added sugar and oil. Dairy Low-fat or nonfat dairy products, such as skim or 1% milk, 2% or reduced-fat cheeses, low-fat and fat-free ricotta or cottage cheese, or plain low-fat and nonfat yogurt. Fats and oils Tub margarine without trans fats. Light or reduced-fat mayonnaise and salad dressings. Avocado. Olive, canola, sesame, or safflower oils. The items listed above may not be a complete list of foods and beverages you can eat. Contact a dietitian for more information. What foods should I avoid? Fruits Canned fruit in heavy syrup. Fruit in cream or butter sauce. Fried fruit. Vegetables Vegetables cooked in cheese, cream, or butter sauce. Fried vegetables. Grains White bread. White pasta. White rice. Cornbread. Bagels, pastries, and croissants. Crackers and snack foods that contain trans fat and hydrogenated oils. Meats and other proteins Fatty cuts of meat. Ribs, chicken wings, bacon, sausage, bologna, salami, chitterlings, fatback, hot dogs, bratwurst, and packaged lunch meats. Liver and organ meats. Whole eggs and egg yolks. Chicken and [place] with skin. Fried meat. Dairy Whole or 2% milk, cream, half-and-half, and cream cheese. Whole milk cheeses. Whole-fat or sweetened yogurt. Full-fat cheeses. Nondairy creamers and whipped toppings. Processed cheese, cheese spreads, and cheese curds. Fats and oils Butter, stick margarine, lard, shortening, ghee, or bacon fat. Coconut, palm kernel, and palm oils. Beverages Alcohol. Sugar-sweetened drinks such as sodas, lemonade, and fruit drinks. Sweets and desserts Corn syrup, sugars, honey, and molasses. Candy. Jam and jelly. Syrup. Sweetened cereals. Cookies, pies, cakes, donuts, muffins, and ice cream. The items listed above may not be a complete list of foods and beverages you should avoid. Contact a dietitian for more information. Summary Your body needs fat and  cholesterol for basic functions. However, eating too much of these things can be harmful to your health. Work with your health care provider and dietitian to follow a diet that limits fat and cholesterol. Doing this may help lower your risk for heart disease and other conditions. Choose healthy fats, such as monounsaturated and polyunsaturated fats, and foods high in omega-3 fatty acids. Eat fiber-rich foods, such as whole grains, beans, peas, fruits, and vegetables. Limit or avoid alcohol, fried foods, and foods high in saturated fats, partially hydrogenated oils, and sugar. This information is not intended to replace advice given to you by your health care provider. Make sure you discuss any questions you have with your health care provider. Document Revised: 03/26/2021 Document Reviewed: 03/26/2021 Elsevier Patient Education  [place] [facility] [employer] [facility]) Exercise Recommendation  Being physically active is important to prevent heart disease and stroke, the nation's No. 1and No. 5killers. To improve overall cardiovascular health, we suggest at least 150 minutes per week of moderate exercise or 75 minutes per week of vigorous exercise (or a combination of moderate and vigorous activity). Thirty minutes a day, five times a week is an easy goal to remember. You will also experience benefits even if you divide your time into two or three segments of 10 to 15 minutes per day.  For people who would benefit from lowering their blood pressure or cholesterol, we recommend 40 minutes of aerobic exercise of moderate to vigorous intensity three to four times a week  to lower the risk for heart attack and stroke.  Physical activity is anything that makes you move your body and burn calories.  This includes things like climbing stairs or playing sports. Aerobic exercises benefit your heart, and include walking, jogging, swimming or biking. Strength and stretching exercises are best for  overall stamina and flexibility.  The simplest, positive change you can make to effectively improve your heart health is to start walking. It's enjoyable, free, easy, social and great exercise. A walking program is flexible and boasts high success rates because people can stick with it. It's easy for walking to become a regular and satisfying part of life.   For Overall Cardiovascular Health: At least 30 minutes of moderate-intensity aerobic activity at least 5 days per week for a total of 150  OR  At least 25 minutes of vigorous aerobic activity at least 3 days per week for a total of 75 minutes; or a combination of moderate- and vigorous-intensity aerobic activity  AND  Moderate- to high-intensity muscle-strengthening activity at least 2 days per week for additional health benefits.  For Lowering Blood Pressure and Cholesterol An average 40 minutes of moderate- to vigorous-intensity aerobic activity 3 or 4 times per week  What if I can't make it to the time goal? Something is always better than nothing! And everyone has to start somewhere. Even if you've been sedentary for years, today is the day you can begin to make healthy changes in your life. If you don't think you'll make it for 30 or 40 minutes, set a reachable goal for today. You can work up toward your overall goal by increasing your time as you get stronger. Don't let all-or-nothing thinking rob you of doing what you can every day.  Source:http://www.heart.org

## 2022-05-13 LAB — COMPREHENSIVE METABOLIC PANEL
ALT: 14 IU/L (ref 0–32)
AST: 13 IU/L (ref 0–40)
Albumin/Globulin Ratio: 1.9 (ref 1.2–2.2)
Albumin: 4.7 g/dL (ref 3.8–4.9)
Alkaline Phosphatase: 113 IU/L (ref 44–121)
BUN/Creatinine Ratio: 10 (ref 9–23)
BUN: 10 mg/dL (ref 6–24)
Bilirubin Total: 0.4 mg/dL (ref 0.0–1.2)
CO2: 21 mmol/L (ref 20–29)
Calcium: 10 mg/dL (ref 8.7–10.2)
Chloride: 101 mmol/L (ref 96–106)
Creatinine, Ser: 1 mg/dL (ref 0.57–1.00)
Globulin, Total: 2.5 g/dL (ref 1.5–4.5)
Glucose: 84 mg/dL (ref 70–99)
Potassium: 4.3 mmol/L (ref 3.5–5.2)
Sodium: 139 mmol/L (ref 134–144)
Total Protein: 7.2 g/dL (ref 6.0–8.5)
eGFR: 68 mL/min/{1.73_m2} (ref >59)

## 2022-05-13 LAB — THYROID PANEL WITH TSH
Free Thyroxine Index: 2.1 (ref 1.2–4.9)
T3 Uptake Ratio: 25 % (ref 24–39)
T4, Total: 8.3 ug/dL (ref 4.5–12.0)
TSH: 0.765 u[IU]/mL (ref 0.450–4.500)

## 2022-05-13 LAB — CBC WITH DIFF/PLATELET
Basophils Absolute: 0.1 10*3/uL (ref 0.0–0.2)
Basos: 1 %
EOS (ABSOLUTE): 0.2 10*3/uL (ref 0.0–0.4)
Eos: 3 %
Hematocrit: 38.9 % (ref 34.0–46.6)
Hemoglobin: 13.2 g/dL (ref 11.1–15.9)
Immature Grans (Abs): 0 10*3/uL (ref 0.0–0.1)
Immature Granulocytes: 0 %
Lymphocytes Absolute: 1.7 10*3/uL (ref 0.7–3.1)
Lymphs: 25 %
MCH: 29.3 pg (ref 26.6–33.0)
MCHC: 33.9 g/dL (ref 31.5–35.7)
MCV: 86 fL (ref 79–97)
Monocytes Absolute: 0.4 10*3/uL (ref 0.1–0.9)
Monocytes: 6 %
Neutrophils Absolute: 4.3 10*3/uL (ref 1.4–7.0)
Neutrophils: 65 %
Platelets: 342 10*3/uL (ref 150–450)
RBC: 4.5 x10E6/uL (ref 3.77–5.28)
RDW: 12.9 % (ref 11.7–15.4)
WBC: 6.6 10*3/uL (ref 3.4–10.8)

## 2022-05-13 LAB — VITAMIN D 25 HYDROXY (VIT D DEFICIENCY, FRACTURES): Vit D, 25-Hydroxy: 37.2 ng/mL (ref 30.0–100.0)

## 2022-05-13 LAB — LIPID PANEL
Chol/HDL Ratio: 3.4 ratio (ref 0.0–4.4)
Cholesterol, Total: 171 mg/dL (ref 100–199)
HDL: 51 mg/dL (ref >39)
LDL Chol Calc (NIH): 97 mg/dL (ref 0–99)
Triglycerides: 130 mg/dL (ref 0–149)
VLDL Cholesterol Cal: 23 mg/dL (ref 5–40)

## 2022-05-13 LAB — HEMOGLOBIN A1C
Est. average glucose Bld gHb Est-mCnc: 105 mg/dL
Hgb A1c MFr Bld: 5.3 % (ref 4.8–5.6)

## 2022-05-17 ENCOUNTER — Other Ambulatory Visit (HOSPITAL_COMMUNITY)
Admission: RE | Admit: 2022-05-17 | Discharge: 2022-05-17 | Disposition: A | Discharge status: Home / Self Care | Payer: 59 | Source: Ambulatory Visit | Attending: Radiology | Admitting: Radiology

## 2022-05-17 ENCOUNTER — Ambulatory Visit
Admission: RE | Admit: 2022-05-17 | Discharge: 2022-05-17 | Disposition: A | Discharge status: Home / Self Care | Payer: 59 | Source: Ambulatory Visit | Attending: Physician Assistant | Admitting: Physician Assistant

## 2022-05-17 DIAGNOSIS — C73 Malignant neoplasm of thyroid gland: Secondary | ICD-10-CM | POA: Insufficient documentation

## 2022-05-17 DIAGNOSIS — E042 Nontoxic multinodular goiter: Secondary | ICD-10-CM

## 2022-05-17 DIAGNOSIS — E041 Nontoxic single thyroid nodule: Secondary | ICD-10-CM | POA: Diagnosis not present

## 2022-05-18 LAB — CYTOLOGY - NON PAP

## 2022-05-25 DIAGNOSIS — C73 Malignant neoplasm of thyroid gland: Secondary | ICD-10-CM | POA: Diagnosis not present

## 2022-05-25 DIAGNOSIS — K219 Gastro-esophageal reflux disease without esophagitis: Secondary | ICD-10-CM | POA: Diagnosis not present

## 2022-06-01 ENCOUNTER — Other Ambulatory Visit (HOSPITAL_COMMUNITY): Payer: Self-pay

## 2022-06-10 ENCOUNTER — Other Ambulatory Visit (HOSPITAL_COMMUNITY): Payer: Self-pay

## 2022-06-18 ENCOUNTER — Other Ambulatory Visit (HOSPITAL_COMMUNITY): Payer: Self-pay

## 2022-06-20 ENCOUNTER — Other Ambulatory Visit: Payer: Self-pay | Admitting: Otolaryngology

## 2022-06-20 ENCOUNTER — Other Ambulatory Visit (HOSPITAL_COMMUNITY): Payer: Self-pay

## 2022-07-06 ENCOUNTER — Other Ambulatory Visit (HOSPITAL_COMMUNITY): Payer: Self-pay

## 2022-08-05 ENCOUNTER — Other Ambulatory Visit (HOSPITAL_COMMUNITY): Payer: Self-pay

## 2022-08-29 ENCOUNTER — Other Ambulatory Visit (HOSPITAL_BASED_OUTPATIENT_CLINIC_OR_DEPARTMENT_OTHER): Payer: Self-pay | Admitting: Family Medicine

## 2022-08-29 ENCOUNTER — Other Ambulatory Visit (HOSPITAL_COMMUNITY): Payer: Self-pay

## 2022-08-29 DIAGNOSIS — K219 Gastro-esophageal reflux disease without esophagitis: Secondary | ICD-10-CM

## 2022-08-29 MED ORDER — ESOMEPRAZOLE MAGNESIUM 40 MG PO CPDR
40.0000 mg | DELAYED_RELEASE_CAPSULE | Freq: Every day | ORAL | 3 refills | Status: DC
  Filled 2022-08-29: qty 90, 90d supply, fill #0
  Filled 2022-11-07 – 2022-11-29 (×2): qty 90, 90d supply, fill #1
  Filled 2023-02-21: qty 90, 90d supply, fill #2
  Filled 2023-05-18: qty 90, 90d supply, fill #3

## 2022-08-30 NOTE — Pre-Procedure Instructions (Signed)
Surgical Instructions    Your procedure is scheduled on September 07, 2022.  Report to [facility] Main Entrance "A" at 6:30 A.M., then check in with the Admitting office.  Call this number if you have problems the morning of surgery:  [phone]   If you have any questions prior to your surgery date call [phone]: Open Monday-Friday 8am-4pm    Remember:  Do not eat after midnight the night before your surgery  You may drink clear liquids until 5:30 AM the morning of your surgery.   Clear liquids allowed are: Water, Non-Citrus Juices (without pulp), Carbonated Beverages, Clear Tea, Black Coffee Only (NO MILK, CREAM OR POWDERED CREAMER of any kind), and Gatorade.     Take these medicines the morning of surgery with A SIP OF WATER:  atorvastatin (LIPITOR)  buPROPion (WELLBUTRIN)   esomeprazole (NEXIUM)   venlafaxine XR (EFFEXOR-XR)    As of today, STOP taking any Aspirin (unless otherwise instructed by your surgeon) Aleve, Naproxen, Ibuprofen, Motrin, Advil, Goody's, BC's, all herbal medications, fish oil, and all vitamins.   WHAT DO I DO ABOUT MY DIABETES MEDICATION?   STOP taking your weekly dose of Semaglutide (OZEMPIC) one week prior to your surgery.   HOW TO MANAGE YOUR DIABETES BEFORE AND AFTER SURGERY  Why is it important to control my blood sugar before and after surgery? Improving blood sugar levels before and after surgery helps healing and can limit problems. A way of improving blood sugar control is eating a healthy diet by:  Eating less sugar and carbohydrates  Increasing activity/exercise  Talking with your doctor about reaching your blood sugar goals High blood sugars (greater than 180 mg/dL) can raise your risk of infections and slow your recovery, so you will need to focus on controlling your diabetes during the weeks before surgery. Make sure that the doctor who takes care of your diabetes knows about your planned surgery including the date and  location.  How do I manage my blood sugar before surgery? Check your blood sugar at least 4 times a day, starting 2 days before surgery, to make sure that the level is not too high or low.  Check your blood sugar the morning of your surgery when you wake up and every 2 hours until you get to the Short Stay unit.  If your blood sugar is less than 70 mg/dL, you will need to treat for low blood sugar: Do not take insulin. Treat a low blood sugar (less than 70 mg/dL) with  cup of clear juice (cranberry or apple), 4 glucose tablets, OR glucose gel. Recheck blood sugar in 15 minutes after treatment (to make sure it is greater than 70 mg/dL). If your blood sugar is not greater than 70 mg/dL on recheck, call [phone] for further instructions. Report your blood sugar to the short stay nurse when you get to Short Stay.  If you are admitted to the hospital after surgery: Your blood sugar will be checked by the staff and you will probably be given insulin after surgery (instead of oral diabetes medicines) to make sure you have good blood sugar levels. The goal for blood sugar control after surgery is 80-180 mg/dL.                      Do NOT Smoke (Tobacco/Vaping) for 24 hours prior to your procedure.  If you use a CPAP at night, you may bring your mask/headgear for your overnight stay.   Contacts, glasses, piercing's,  hearing aid's, dentures or partials may not be worn into surgery, please bring cases for these belongings.    For patients admitted to the hospital, discharge time will be determined by your treatment team.   Patients discharged the day of surgery will not be allowed to drive home, and someone needs to stay with them for 24 hours.  SURGICAL WAITING ROOM VISITATION Patients having surgery or a procedure may have no more than 2 support people in the waiting area - these visitors may rotate.   Children under the age of 27 must have an adult with them who is not the patient. If  the patient needs to stay at the hospital during part of their recovery, the visitor guidelines for inpatient rooms apply. Pre-op nurse will coordinate an appropriate time for 1 support person to accompany patient in pre-op.  This support person may not rotate.   Please refer to the [facility] website for the visitor guidelines for Inpatients (after your surgery is over and you are in a regular room).    Special instructions:   [place]- Preparing For Surgery  Before surgery, you can play an important role. Because skin is not sterile, your skin needs to be as free of germs as possible. You can reduce the number of germs on your skin by washing with CHG (chlorahexidine gluconate) Soap before surgery.  CHG is an antiseptic cleaner which kills germs and bonds with the skin to continue killing germs even after washing.    Oral Hygiene is also important to reduce your risk of infection.  Remember - BRUSH YOUR TEETH THE MORNING OF SURGERY WITH YOUR REGULAR TOOTHPASTE  Please do not use if you have an allergy to CHG or antibacterial soaps. If your skin becomes reddened/irritated stop using the CHG.  Do not shave (including legs and underarms) for at least 48 hours prior to first CHG shower. It is OK to shave your face.  Please follow these instructions carefully.   Shower the NIGHT BEFORE SURGERY and the MORNING OF SURGERY  If you chose to wash your hair, wash your hair first as usual with your normal shampoo.  After you shampoo, rinse your hair and body thoroughly to remove the shampoo.  Use CHG Soap as you would any other liquid soap. You can apply CHG directly to the skin and wash gently with a scrungie or a clean washcloth.   Apply the CHG Soap to your body ONLY FROM THE NECK DOWN.  Do not use on open wounds or open sores. Avoid contact with your eyes, ears, mouth and genitals (private parts). Wash Face and genitals (private parts)  with your normal soap.   Wash thoroughly, paying  special attention to the area where your surgery will be performed.  Thoroughly rinse your body with warm water from the neck down.  DO NOT shower/wash with your normal soap after using and rinsing off the CHG Soap.  Pat yourself dry with a CLEAN TOWEL.  Wear CLEAN PAJAMAS to bed the night before surgery  Place CLEAN SHEETS on your bed the night before your surgery  DO NOT SLEEP WITH PETS.   Day of Surgery: Take a shower with CHG soap. Do not wear jewelry or makeup Do not wear lotions, powders, perfumes/colognes, or deodorant. Do not shave 48 hours prior to surgery.  Men may shave face and neck. Do not bring valuables to the hospital.  [facility] is not responsible for any belongings or valuables. Do not wear nail  polish, gel polish, artificial nails, or any other type of covering on natural nails (fingers and toes) If you have artificial nails or gel coating that need to be removed by a nail salon, please have this removed prior to surgery. Artificial nails or gel coating may interfere with anesthesia's ability to adequately monitor your vital signs.  Wear Clean/Comfortable clothing the morning of surgery Remember to brush your teeth WITH YOUR REGULAR TOOTHPASTE.   Please read over the following fact sheets that you were given.    If you received a COVID test during your pre-op visit  it is requested that you wear a mask when out in public, stay away from anyone that may not be feeling well and notify your surgeon if you develop symptoms. If you have been in contact with anyone that has tested positive in the last 10 days please notify you surgeon.

## 2022-08-31 ENCOUNTER — Encounter (HOSPITAL_COMMUNITY)
Admission: RE | Admit: 2022-08-31 | Discharge: 2022-08-31 | Disposition: A | Discharge status: Home / Self Care | Payer: 59 | Source: Ambulatory Visit | Attending: Otolaryngology | Admitting: Otolaryngology

## 2022-08-31 ENCOUNTER — Other Ambulatory Visit: Payer: Self-pay

## 2022-08-31 ENCOUNTER — Encounter (HOSPITAL_COMMUNITY): Payer: Self-pay

## 2022-08-31 VITALS — BP 125/90 | HR 84 | Temp 98.0°F | Resp 17 | Ht 65.0 in | Wt 193.0 lb

## 2022-08-31 DIAGNOSIS — Z01818 Encounter for other preprocedural examination: Secondary | ICD-10-CM | POA: Insufficient documentation

## 2022-08-31 DIAGNOSIS — E119 Type 2 diabetes mellitus without complications: Secondary | ICD-10-CM | POA: Insufficient documentation

## 2022-08-31 HISTORY — DX: Gastro-esophageal reflux disease without esophagitis: K21.9

## 2022-08-31 HISTORY — DX: Anemia, unspecified: D64.9

## 2022-08-31 HISTORY — DX: Restless legs syndrome: G25.81

## 2022-08-31 HISTORY — DX: Personal history of other diseases of the digestive system: Z87.19

## 2022-08-31 HISTORY — DX: Other specified postprocedural states: Z98.890

## 2022-08-31 LAB — GLUCOSE, CAPILLARY: Glucose-Capillary: 84 mg/dL (ref 70–99)

## 2022-08-31 LAB — BASIC METABOLIC PANEL
Anion gap: 7 (ref 5–15)
BUN: 10 mg/dL (ref 6–20)
CO2: 26 mmol/L (ref 22–32)
Calcium: 9.7 mg/dL (ref 8.9–10.3)
Chloride: 107 mmol/L (ref 98–111)
Creatinine, Ser: 0.89 mg/dL (ref 0.44–1.00)
GFR, Estimated: >60 mL/min (ref >60)
Glucose, Bld: 94 mg/dL (ref 70–99)
Potassium: 4.3 mmol/L (ref 3.5–5.1)
Sodium: 140 mmol/L (ref 135–145)

## 2022-08-31 LAB — CBC
HCT: 39.6 % (ref 36.0–46.0)
Hemoglobin: 12.8 g/dL (ref 12.0–15.0)
MCH: 28.9 pg (ref 26.0–34.0)
MCHC: 32.3 g/dL (ref 30.0–36.0)
MCV: 89.4 fL (ref 80.0–100.0)
Platelets: 313 10*3/uL (ref 150–400)
RBC: 4.43 MIL/uL (ref 3.87–5.11)
RDW: 12.6 % (ref 11.5–15.5)
WBC: 5.6 10*3/uL (ref 4.0–10.5)
nRBC: 0 % (ref 0.0–0.2)

## 2022-08-31 LAB — HEMOGLOBIN A1C
Hgb A1c MFr Bld: 5.3 % (ref 4.8–5.6)
Mean Plasma Glucose: 105.41 mg/dL

## 2022-08-31 NOTE — Progress Notes (Signed)
PCP - Jacolyn Reedy, NP Cardiologist - denies  PPM/ICD - denies Device Orders - n/a Rep Notified - n/a  Chest x-ray - n/a EKG - 08/31/2022 Stress Test - denies ECHO - denies Cardiac Cath - denies  Sleep Study - denies  CPAP - n/a  Patient is DM 2. Checks her sugar 3 times a week. Normal ranges between 70-100 per pt. CBG at PAT visit=84.  Blood Thinner Instructions:n/a Aspirin Instructions:n/a  ERAS Protcol -yes. Clear liquids until 0530 morning of surgery.  PRE-SURGERY Ensure or G2- none ordered   COVID TEST- n/a   Anesthesia review: NO  Patient denies shortness of breath, fever, cough and chest pain at PAT appointment   All instructions explained to the patient, with a verbal understanding of the material. Patient agrees to go over the instructions while at home for a better understanding. Patient also instructed to self quarantine after being tested for COVID-19. The opportunity to ask questions was provided.

## 2022-09-06 NOTE — Anesthesia Preprocedure Evaluation (Signed)
Anesthesia Evaluation  Patient identified by MRN, date of birth, ID band Patient awake    Reviewed: Allergy & Precautions, NPO status , Patient's Chart, lab work & pertinent test results  History of Anesthesia Complications (+) PONV and history of anesthetic complications  Airway Mallampati: II  TM Distance: >3 FB Neck ROM: Full    Dental  (+) Dental Advisory Given, Teeth Intact   Pulmonary neg pulmonary ROS   Pulmonary exam normal        Cardiovascular negative cardio ROS Normal cardiovascular exam     Neuro/Psych  PSYCHIATRIC DISORDERS Anxiety Depression    negative neurological ROS     GI/Hepatic Neg liver ROS, hiatal hernia,GERD  Medicated and Controlled,,  Endo/Other  diabetes, Type 2, Oral Hypoglycemic Agents   Papillary thyroid carcinoma  Obesity   Renal/GU negative Renal ROS     Musculoskeletal negative musculoskeletal ROS (+)    Abdominal   Peds  Hematology negative hematology ROS (+)   Anesthesia Other Findings RLS   Reproductive/Obstetrics                             Anesthesia Physical Anesthesia Plan  ASA: 3  Anesthesia Plan: General   Post-op Pain Management: Tylenol PO (pre-op)* and Celebrex PO (pre-op)*   Induction: Intravenous  PONV Risk Score and Plan: 4 or greater and Treatment may vary due to age or medical condition, Ondansetron, Dexamethasone, Midazolam and Scopolamine patch - Pre-op  Airway Management Planned: Oral ETT  Additional Equipment: None  Intra-op Plan:   Post-operative Plan: Extubation in OR  Informed Consent: I have reviewed the patients History and Physical, chart, labs and discussed the procedure including the risks, benefits and alternatives for the proposed anesthesia with the patient or authorized representative who has indicated his/her understanding and acceptance.     Dental advisory given  Plan Discussed with: CRNA and  Anesthesiologist  Anesthesia Plan Comments:         Anesthesia Quick Evaluation

## 2022-09-07 ENCOUNTER — Encounter (HOSPITAL_COMMUNITY)
Admission: RE | Disposition: A | Discharge status: Home / Self Care | Payer: Self-pay | Source: Home / Self Care | Attending: Otolaryngology

## 2022-09-07 ENCOUNTER — Observation Stay (HOSPITAL_COMMUNITY)
Admission: RE | Admit: 2022-09-07 | Discharge: 2022-09-08 | Disposition: A | Discharge status: Home / Self Care | Payer: 59 | Attending: Otolaryngology | Admitting: Otolaryngology

## 2022-09-07 ENCOUNTER — Other Ambulatory Visit: Payer: Self-pay

## 2022-09-07 ENCOUNTER — Ambulatory Visit (HOSPITAL_BASED_OUTPATIENT_CLINIC_OR_DEPARTMENT_OTHER): Payer: 59 | Admitting: Anesthesiology

## 2022-09-07 ENCOUNTER — Ambulatory Visit (HOSPITAL_COMMUNITY): Payer: 59 | Admitting: Anesthesiology

## 2022-09-07 DIAGNOSIS — Z79899 Other long term (current) drug therapy: Secondary | ICD-10-CM | POA: Diagnosis not present

## 2022-09-07 DIAGNOSIS — Z7984 Long term (current) use of oral hypoglycemic drugs: Secondary | ICD-10-CM | POA: Diagnosis not present

## 2022-09-07 DIAGNOSIS — C73 Malignant neoplasm of thyroid gland: Secondary | ICD-10-CM | POA: Diagnosis not present

## 2022-09-07 DIAGNOSIS — K219 Gastro-esophageal reflux disease without esophagitis: Secondary | ICD-10-CM | POA: Diagnosis not present

## 2022-09-07 DIAGNOSIS — F418 Other specified anxiety disorders: Secondary | ICD-10-CM

## 2022-09-07 DIAGNOSIS — E119 Type 2 diabetes mellitus without complications: Secondary | ICD-10-CM | POA: Diagnosis not present

## 2022-09-07 HISTORY — PX: THYROIDECTOMY: SHX17

## 2022-09-07 LAB — GLUCOSE, CAPILLARY
Glucose-Capillary: 98 mg/dL (ref 70–99)
Glucose-Capillary: 113 mg/dL — ABNORMAL HIGH (ref 70–99)
Glucose-Capillary: 118 mg/dL — ABNORMAL HIGH (ref 70–99)
Glucose-Capillary: 117 mg/dL — ABNORMAL HIGH (ref 70–99)

## 2022-09-07 LAB — ALBUMIN
Albumin: 3.5 g/dL (ref 3.5–5.0)
Albumin: 3.5 g/dL (ref 3.5–5.0)

## 2022-09-07 LAB — CALCIUM
Calcium: 8.8 mg/dL — ABNORMAL LOW (ref 8.9–10.3)
Calcium: 8.8 mg/dL — ABNORMAL LOW (ref 8.9–10.3)

## 2022-09-07 LAB — MAGNESIUM: Magnesium: 1.7 mg/dL (ref 1.7–2.4)

## 2022-09-07 SURGERY — THYROIDECTOMY
Anesthesia: General | Site: Neck | Laterality: Bilateral

## 2022-09-07 MED ORDER — LIDOCAINE-EPINEPHRINE 1 %-1:100000 IJ SOLN
INTRAMUSCULAR | Status: AC
  Filled 2022-09-07: qty 1

## 2022-09-07 MED ORDER — HEMOSTATIC AGENTS (NO CHARGE) OPTIME
TOPICAL | Status: DC | PRN
  Administered 2022-09-07: 1 via TOPICAL

## 2022-09-07 MED ORDER — LIDOCAINE 2% (20 MG/ML) 5 ML SYRINGE
INTRAMUSCULAR | Status: DC | PRN
  Administered 2022-09-07: 60 mg via INTRAVENOUS

## 2022-09-07 MED ORDER — DOCUSATE SODIUM 100 MG PO CAPS
100.0000 mg | ORAL_CAPSULE | Freq: Two times a day (BID) | ORAL | Status: DC
  Administered 2022-09-07 – 2022-09-08 (×2): 100 mg via ORAL
  Filled 2022-09-07 (×2): qty 1

## 2022-09-07 MED ORDER — MIDAZOLAM HCL 2 MG/2ML IJ SOLN
INTRAMUSCULAR | Status: AC
  Filled 2022-09-07: qty 2

## 2022-09-07 MED ORDER — FENTANYL CITRATE (PF) 100 MCG/2ML IJ SOLN
INTRAMUSCULAR | Status: AC
  Filled 2022-09-07: qty 2

## 2022-09-07 MED ORDER — FENTANYL CITRATE (PF) 250 MCG/5ML IJ SOLN
INTRAMUSCULAR | Status: AC
  Filled 2022-09-07: qty 5

## 2022-09-07 MED ORDER — PHENYLEPHRINE 80 MCG/ML (10ML) SYRINGE FOR IV PUSH (FOR BLOOD PRESSURE SUPPORT)
PREFILLED_SYRINGE | INTRAVENOUS | Status: DC | PRN
  Administered 2022-09-07 (×3): 80 ug via INTRAVENOUS

## 2022-09-07 MED ORDER — OYSTER SHELL CALCIUM/D3 500-5 MG-MCG PO TABS
2.0000 | ORAL_TABLET | Freq: Two times a day (BID) | ORAL | Status: DC
  Administered 2022-09-08: 2 via ORAL
  Filled 2022-09-07 (×2): qty 2

## 2022-09-07 MED ORDER — FENTANYL CITRATE (PF) 250 MCG/5ML IJ SOLN
INTRAMUSCULAR | Status: DC | PRN
  Administered 2022-09-07: 50 ug via INTRAVENOUS
  Administered 2022-09-07: 100 ug via INTRAVENOUS
  Administered 2022-09-07 (×2): 50 ug via INTRAVENOUS

## 2022-09-07 MED ORDER — INSULIN ASPART 100 UNIT/ML IJ SOLN: 0.0000 [IU] | INTRAMUSCULAR | Status: DC | PRN

## 2022-09-07 MED ORDER — HYDROCODONE-ACETAMINOPHEN 5-325 MG PO TABS
1.0000 | ORAL_TABLET | ORAL | Status: DC | PRN
  Administered 2022-09-07: 1 via ORAL
  Filled 2022-09-07: qty 1

## 2022-09-07 MED ORDER — IBUPROFEN 600 MG PO TABS: 600.0000 mg | ORAL_TABLET | Freq: Four times a day (QID) | ORAL | Status: DC | PRN

## 2022-09-07 MED ORDER — LIDOCAINE-EPINEPHRINE 1 %-1:100000 IJ SOLN
INTRAMUSCULAR | Status: DC | PRN
  Administered 2022-09-07: 10 mL

## 2022-09-07 MED ORDER — BUPROPION HCL 100 MG PO TABS
100.0000 mg | ORAL_TABLET | Freq: Every day | ORAL | Status: DC
  Administered 2022-09-08: 100 mg via ORAL
  Filled 2022-09-07: qty 1

## 2022-09-07 MED ORDER — OXYCODONE HCL 5 MG PO TABS: 5.0000 mg | ORAL_TABLET | Freq: Once | ORAL | Status: DC | PRN

## 2022-09-07 MED ORDER — ATORVASTATIN CALCIUM 10 MG PO TABS
20.0000 mg | ORAL_TABLET | Freq: Every day | ORAL | Status: DC
  Administered 2022-09-08: 20 mg via ORAL
  Filled 2022-09-07: qty 2

## 2022-09-07 MED ORDER — LACTATED RINGERS IV SOLN: INTRAVENOUS | Status: DC

## 2022-09-07 MED ORDER — CEFAZOLIN SODIUM-DEXTROSE 2-4 GM/100ML-% IV SOLN
2.0000 g | INTRAVENOUS | Status: AC
  Administered 2022-09-07 (×2): 2 g via INTRAVENOUS
  Filled 2022-09-07: qty 100

## 2022-09-07 MED ORDER — SCOPOLAMINE 1 MG/3DAYS TD PT72
1.0000 | MEDICATED_PATCH | TRANSDERMAL | Status: DC
  Administered 2022-09-07: 1.5 mg via TRANSDERMAL
  Filled 2022-09-07: qty 1

## 2022-09-07 MED ORDER — PROPOFOL 10 MG/ML IV BOLUS
INTRAVENOUS | Status: AC
  Filled 2022-09-07: qty 20

## 2022-09-07 MED ORDER — MIDAZOLAM HCL 2 MG/2ML IJ SOLN
INTRAMUSCULAR | Status: DC | PRN
  Administered 2022-09-07: 2 mg via INTRAVENOUS

## 2022-09-07 MED ORDER — ACETAMINOPHEN 325 MG PO TABS: 650.0000 mg | ORAL_TABLET | Freq: Four times a day (QID) | ORAL | Status: DC | PRN

## 2022-09-07 MED ORDER — ROCURONIUM BROMIDE 10 MG/ML (PF) SYRINGE
PREFILLED_SYRINGE | INTRAVENOUS | Status: DC | PRN
  Administered 2022-09-07: 50 mg via INTRAVENOUS

## 2022-09-07 MED ORDER — SUGAMMADEX SODIUM 200 MG/2ML IV SOLN
INTRAVENOUS | Status: DC | PRN
  Administered 2022-09-07: 200 mg via INTRAVENOUS

## 2022-09-07 MED ORDER — ONDANSETRON HCL 4 MG PO TABS: 4.0000 mg | ORAL_TABLET | ORAL | Status: DC | PRN

## 2022-09-07 MED ORDER — CELECOXIB 200 MG PO CAPS
200.0000 mg | ORAL_CAPSULE | Freq: Once | ORAL | Status: AC
  Administered 2022-09-07: 200 mg via ORAL
  Filled 2022-09-07: qty 1

## 2022-09-07 MED ORDER — PANTOPRAZOLE SODIUM 40 MG PO TBEC
40.0000 mg | DELAYED_RELEASE_TABLET | Freq: Every day | ORAL | Status: DC
  Administered 2022-09-08: 40 mg via ORAL
  Filled 2022-09-07: qty 1

## 2022-09-07 MED ORDER — ONDANSETRON HCL 4 MG/2ML IJ SOLN
INTRAMUSCULAR | Status: DC | PRN
  Administered 2022-09-07: 4 mg via INTRAVENOUS

## 2022-09-07 MED ORDER — PHENYLEPHRINE HCL-NACL 20-0.9 MG/250ML-% IV SOLN
INTRAVENOUS | Status: DC | PRN
  Administered 2022-09-07: 25 ug/min via INTRAVENOUS

## 2022-09-07 MED ORDER — ONDANSETRON HCL 4 MG/2ML IJ SOLN: 4.0000 mg | INTRAMUSCULAR | Status: DC | PRN

## 2022-09-07 MED ORDER — FENTANYL CITRATE (PF) 100 MCG/2ML IJ SOLN
25.0000 ug | INTRAMUSCULAR | Status: DC | PRN
  Administered 2022-09-07 (×2): 50 ug via INTRAVENOUS

## 2022-09-07 MED ORDER — OXYCODONE HCL 5 MG/5ML PO SOLN: 5.0000 mg | Freq: Once | ORAL | Status: DC | PRN

## 2022-09-07 MED ORDER — ACETAMINOPHEN 500 MG PO TABS
1000.0000 mg | ORAL_TABLET | Freq: Once | ORAL | Status: AC
  Administered 2022-09-07: 1000 mg via ORAL
  Filled 2022-09-07: qty 2

## 2022-09-07 MED ORDER — CHLORHEXIDINE GLUCONATE 0.12 % MT SOLN
15.0000 mL | Freq: Once | OROMUCOSAL | Status: AC
  Administered 2022-09-07: 15 mL via OROMUCOSAL
  Filled 2022-09-07: qty 15

## 2022-09-07 MED ORDER — DEXAMETHASONE SODIUM PHOSPHATE 10 MG/ML IJ SOLN
INTRAMUSCULAR | Status: DC | PRN
  Administered 2022-09-07: 10 mg via INTRAVENOUS

## 2022-09-07 MED ORDER — PROPOFOL 10 MG/ML IV BOLUS
INTRAVENOUS | Status: DC | PRN
  Administered 2022-09-07: 200 mg via INTRAVENOUS

## 2022-09-07 MED ORDER — GABAPENTIN 300 MG PO CAPS
300.0000 mg | ORAL_CAPSULE | Freq: Every day | ORAL | Status: DC
  Administered 2022-09-07: 300 mg via ORAL
  Filled 2022-09-07: qty 1

## 2022-09-07 MED ORDER — BACITRACIN ZINC 500 UNIT/GM EX OINT
1.0000 | TOPICAL_OINTMENT | Freq: Three times a day (TID) | CUTANEOUS | Status: DC
  Administered 2022-09-07 – 2022-09-08 (×3): 1 via TOPICAL
  Filled 2022-09-07: qty 28.35

## 2022-09-07 MED ORDER — LEVOTHYROXINE SODIUM 150 MCG PO TABS
150.0000 ug | ORAL_TABLET | Freq: Every day | ORAL | Status: DC
  Administered 2022-09-08: 150 ug via ORAL
  Filled 2022-09-07: qty 1

## 2022-09-07 MED ORDER — VENLAFAXINE HCL ER 150 MG PO CP24
150.0000 mg | ORAL_CAPSULE | Freq: Every day | ORAL | Status: DC
  Administered 2022-09-08: 150 mg via ORAL
  Filled 2022-09-07 (×2): qty 1

## 2022-09-07 MED ORDER — CEFAZOLIN SODIUM 1 G IJ SOLR
INTRAMUSCULAR | Status: AC
  Filled 2022-09-07: qty 20

## 2022-09-07 MED ORDER — PROMETHAZINE HCL 25 MG/ML IJ SOLN: 6.2500 mg | INTRAMUSCULAR | Status: DC | PRN

## 2022-09-07 MED ORDER — ORAL CARE MOUTH RINSE: 15.0000 mL | Freq: Once | OROMUCOSAL | Status: AC

## 2022-09-07 MED ORDER — 0.9 % SODIUM CHLORIDE (POUR BTL) OPTIME
TOPICAL | Status: DC | PRN
  Administered 2022-09-07: 1000 mL

## 2022-09-07 SURGICAL SUPPLY — 54 items
BAG COUNTER SPONGE SURGICOUNT (BAG) ×1 IMPLANT
BLADE SURG 15 STRL LF DISP TIS (BLADE) ×1 IMPLANT
BLADE SURG 15 STRL SS (BLADE) ×1
CANISTER SUCT 3000ML PPV (MISCELLANEOUS) ×1 IMPLANT
CNTNR URN SCR LID CUP LEK RST (MISCELLANEOUS) IMPLANT
CONT SPEC 4OZ STRL OR WHT (MISCELLANEOUS) ×1
CORD BIPOLAR FORCEPS 12FT (ELECTRODE) ×1 IMPLANT
COVER SURGICAL LIGHT HANDLE (MISCELLANEOUS) ×1 IMPLANT
DERMABOND ADVANCED .7 DNX12 (GAUZE/BANDAGES/DRESSINGS) ×1 IMPLANT
DRAIN JP 10F RND RADIO (DRAIN) IMPLANT
DRAPE HALF SHEET 40X57 (DRAPES) IMPLANT
DRAPE IMP U-DRAPE 54X76 (DRAPES) IMPLANT
ELECT COATED BLADE 2.86 ST (ELECTRODE) ×1 IMPLANT
ELECT REM PT RETURN 9FT ADLT (ELECTROSURGICAL) ×1
ELECTRODE REM PT RTRN 9FT ADLT (ELECTROSURGICAL) ×1 IMPLANT
EVACUATOR SILICONE 100CC (DRAIN) IMPLANT
FORCEPS BIPOLAR SPETZLER 8 1.0 (NEUROSURGERY SUPPLIES) ×1 IMPLANT
GAUZE 4X4 16PLY ~~LOC~~+RFID DBL (SPONGE) ×1 IMPLANT
GLOVE BIO SURGEON STRL SZ 6.5 (GLOVE) IMPLANT
GLOVE BIO SURGEON STRL SZ7 (GLOVE) IMPLANT
GLOVE BIO SURGEON STRL SZ7.5 (GLOVE) ×1 IMPLANT
GOWN STRL REUS W/ TWL LRG LVL3 (GOWN DISPOSABLE) ×2 IMPLANT
GOWN STRL REUS W/TWL LRG LVL3 (GOWN DISPOSABLE) ×3
HEMOSTAT SURGICEL 2X14 (HEMOSTASIS) ×1 IMPLANT
KIT BASIN OR (CUSTOM PROCEDURE TRAY) ×1 IMPLANT
KIT TURNOVER KIT B (KITS) ×1 IMPLANT
LOCATOR NERVE 3 VOLT (DISPOSABLE) IMPLANT
NDL HYPO 25GX1X1/2 BEV (NEEDLE) ×1 IMPLANT
NEEDLE HYPO 25GX1X1/2 BEV (NEEDLE) ×1 IMPLANT
NS IRRIG 1000ML POUR BTL (IV SOLUTION) ×1 IMPLANT
PAD ARMBOARD 7.5X6 YLW CONV (MISCELLANEOUS) ×2 IMPLANT
PATTIES SURGICAL .5 X.5 (GAUZE/BANDAGES/DRESSINGS) IMPLANT
PENCIL SMOKE EVACUATOR (MISCELLANEOUS) ×1 IMPLANT
POSITIONER HEAD DONUT 9IN (MISCELLANEOUS) IMPLANT
PROBE NERVBE PRASS .33 (MISCELLANEOUS) ×1 IMPLANT
SET WALTER ACTIVATION W/DRAPE (SET/KITS/TRAYS/PACK) IMPLANT
SHEARS HARMONIC 9CM CVD (BLADE) ×1 IMPLANT
SPONGE INTESTINAL PEANUT (DISPOSABLE) ×1 IMPLANT
STAPLER VISISTAT 35W (STAPLE) IMPLANT
STRIP CLOSURE SKIN 1/2X4 (GAUZE/BANDAGES/DRESSINGS) IMPLANT
SUT CHROMIC 4 0 PS 2 18 (SUTURE) IMPLANT
SUT MNCRL AB 4-0 PS2 18 (SUTURE) ×1 IMPLANT
SUT SILK 3 0 PS 1 (SUTURE) IMPLANT
SUT SILK 3 0 REEL (SUTURE) IMPLANT
SUT SILK 3 0SH CR/8 30 (SUTURE) ×1 IMPLANT
SUT VIC AB 3-0 SH 27 (SUTURE) ×2
SUT VIC AB 3-0 SH 27X BRD (SUTURE) ×1 IMPLANT
SUT VICRYL 4-0 PS2 18IN ABS (SUTURE) ×1 IMPLANT
SYR BULB IRRIG 60ML STRL (SYRINGE) IMPLANT
TOWEL GREEN STERILE FF (TOWEL DISPOSABLE) ×1 IMPLANT
TRAY ENT MC OR (CUSTOM PROCEDURE TRAY) ×1 IMPLANT
TUBE ENDOTRACH  EMG 6MMTUBE EN (MISCELLANEOUS) ×1
TUBE ENDOTRACH EMG 6MMTUBE EN (MISCELLANEOUS) IMPLANT
WATER STERILE IRR 500ML POUR (IV SOLUTION) IMPLANT

## 2022-09-07 NOTE — Transfer of Care (Signed)
Immediate Anesthesia Transfer of Care Note  Patient: Anne Kramer  Procedure(s) Performed: TOTAL THYROIDECTOMY (Bilateral: Neck)  Patient Location: PACU  Anesthesia Type:General  Level of Consciousness: awake, alert , and oriented  Airway & Oxygen Therapy: Patient Spontanous Breathing and Patient connected to face mask oxygen  Post-op Assessment: Report given to RN and Post -op Vital signs reviewed and stable  Post vital signs: Reviewed and stable  Last Vitals:  Vitals Value Taken Time  BP 110/68 09/07/22 1500  Temp 36.7 C 09/07/22 1445  Pulse 80 09/07/22 1519  Resp 9 09/07/22 1519  SpO2 95 % 09/07/22 1519  Vitals shown include unvalidated device data.  Last Pain:  Vitals:   09/07/22 1445  TempSrc:   PainSc: Asleep         Complications: No notable events documented.

## 2022-09-07 NOTE — Op Note (Signed)
OPERATIVE NOTE  Anne Kramer Date/Time of Admission: 09/07/2022  6:12 AM  CSN: [id number]:[id number] Attending Provider: Ebbie Latus A, DO Room/Bed: MCPO/NONE DOB: Sep 08, 1971 Age: 51 y.o.   Pre-Op Diagnosis: Papillary thyroid carcinoma  Post-Op Diagnosis: Papillary thyroid carcinoma  Procedure: Procedure(s): TOTAL THYROIDECTOMY  Anesthesia: General  Surgeon(s): Rosser, DO  Staff: Circulator: Eudelia Bunch, RN Physician Assistant: Jolene Provost, PA-C Relief Circulator: Kennieth Francois, RN Relief Scrub: Williams, [place] M, RN Scrub Person: Redge Gainer, RN Vendor Representative : Roseanne Kaufman  Implants: * No implants in log *  Specimens: ID Type Source Tests Collected by Time Destination  1 : Thyroid- total Tissue PATH ENT excision SURGICAL PATHOLOGY Yasser Hepp A, DO 70/[id number]/[id number]     Complications: None  EBL: 50 ML  IVF: 1500 ML  Condition: stable  Operative Findings:   4 of  the 4 parathyroid gland were identified and  were nerurovascularly intact. The bilateral recurrent laryngeal nerves were intact to visual examination and electrical stimulation at 0.4  mA on the right and 0.4  mA on the left.   Description of Operation: The patient was identified in the preoperative holding area.  Informed written consent including risks, benefits, alternatives, and possible complications including bleeding and nerve injury were obtained.  Patient was then taken, under the care of anesthesia, to the operating suite.  Patient was placed in the supine position on the operating table and then general anesthesia was administered using a NIMS endotracheal tube per anesthesia's protocol.  Incision site on anterior neck was marked and infiltrated with 10cc Lidocaine 1% with 1:100,000 epinephrine.  A transverse incision, approximately 5cm in length was made within a skin crease with a 15 blade scalpel. The incision was carried down  through the platysma. Inferiorly and superiorly based subplatysmal flaps were raised to the level of the clavicle and thyroid cartilage, respectively.  Four 2-0 silk sutures were used to retract the skin flaps at each corner of the incision. The midline raphe of the strap muscles was then identified and separated with the Bovie electrocautery in anatomic midline. The strap muscles were then dissected from the right  thyroid capsule. The strap muscles were retracted laterally. The superior pole vasculature was then identified and surrounding tissue was dissected free using both sharp and blunt instrumentation.  The superior pole vasculature was then clamped, cut, and ligated using Harmonic Scalpel.  The inferior pole was then identified and surrounding tissue were dissected free using both sharp and blunt instrumentation.  The inferior pole vessels were then clamped, cut, and ligated using Harmonic Scalpel. The thyroid was then retracted medially.  The middle thyroid vein was identified and ligated.  The recurrent laryngeal was then identified in the tracheoesophageal groove.  The nerve integrity monitoring probe was then used to stimulate the recurrent laryngeal nerve to ensure its identification and integrity.  The nerve was carefully dissected superiorly until it entered the larynx.  The right   thyroid was removed from the anterior tracheal wall through Berry's Ligament. Similar procedure was then performed on the left.   Spot bipolar electrocautery was used to achieve meticulous hemostasis, and the dissected nerve was once again stimulated and found to be functional.  The wound bed was copiously irrigated and once again inspected for any bleeding.  There was none.  Surgicel was placed in the wound bed.  A 10 french JP drain was passed through a separate stab incision inferior to the surgical site and sutured in place  using 2-0 silk suture. The strap muscles were then reapproximated using a 3-0 chromic suture in  a running, locking fashion. The incision was then closed in a layered fashion. The platysma was closed with interrupted 3-0 Vicryl sutures.  The subcutaneous tissue was then closed with buried interrupted 4-0 vicryl sutures followed by running 4-0 Monocryl.  The skin was then closed with Dermabond.  Finally, Steristrips were placed over the incision. The patient was returned to the care of anesthesia and was extubated without difficulty.     Jason Coop, [facility]  09/07/2022

## 2022-09-07 NOTE — Discharge Instructions (Signed)
[facility] THYROID SURGERY Post Operative Instructions Office Number [phone]  The Surgery Itself Thyroid or Parathyroid surgery involves general anesthesia. Patients may be quite sedated for several hours after surgery and may remain sleepy for much of the day. Nausea and vomiting is occasionally seen, and usually resolves by the evening of surgery - even without additional medications. Some patients stay one night in the hospital and are discharged the next day. Other patients can go home the evening of surgery. Many patients will have a drain in place after surgery-this is removed the following day before you go home from the hospital or in the office 1-3  days after surgery.   Your Incision Your incision is closed with absorbable sutures and is covered with a small strip of tape or skin glue. You can shower and wash your hair as usual starting 24-48 hours after your drain is removed, as directed by your surgeon. If you did not have a drain in place after surgery, you may shower 24 hours after surgery. You may wash in a bathtub prior to that time if you are careful not to get your neck wet. Do not soak or scrub the incision. You might notice bruising around your incision or upper chest and slight swelling above the scar when you are upright. In addition, the scar may become pink and hard. This hardening will peak at about 3 weeks and may result in some tightness or difficulty swallowing, which will disappear over the next 2 to 3 months. You should apply sunscreen on your incision once directed by your surgeon (usually starting one month  after surgery) EVERY day for the first year after surgery. This will prevent a red or pink scar and give you the best cosmetic result for your scar. A daily moisturizer with sunscreen (example Oil of Olay with SPF 15) is fine.   Limitations You can start resuming normal activities as tolerated 7 days after surgery. For some patients, lifting can cause pain  and stretching at the surgery site for up to 2-3 weeks after surgery. You should not drive or drink alcohol while taking pain medications. Most people can return to work/school in 1-2 weeks after surgery, but there may be physical limitations as far as what you may do while at work.   Medications ? Pain medication should be used for pain as prescribed. Pain is expected after surgery. Your neck will be sore and pain will be worse when the neck is stretched and when you swallow. As the surgical site heals, pain will resolve over the course of a week. It is not uncommon for pain to get worse when you first go home because your activity may increase, but from that point on the pain should improve every day. Pain medications can cause nausea, which can be prevented if you take them with food or milk. ? You may be given a stool softener (Colace) because pain medications may make you  constipated. You can also use an over the counter stool softener.  ? You may be given Bacitracin ointment. If you are, it should be applied to the drain exit site three times a day for 2 days after the drain is removed. It should also be applied to the drain site before and after your first shower after surgery. It is normal to have some red or pink drainage from your drain exit site for 1-2 days after it is removed. ? If you were taking thyroid hormone tablets before your operation,  you will continue this after surgery, but sometimes your surgeon will change the dose. If you were not taking thyroid hormone prior to your operation, your surgeon may prescribe these tablets following surgery if the entire thyroid is removed. During your post-operative visits, you may have a blood test to measure your levels of thyroid hormone and your dose of medication may be adjusted accordingly. ? If you had parathyroid surgery or a total thyroidectomy, you may be instructed to take extra calcium supplements until your blood calcium levels stabilize.  These usually have to be purchased "over the counter" at a drug store and your surgeon will give you specific instructions. Generic brands are fine. Calcium carbonate with Vitamin D or TUMS are usually recommended. If you take any medications for gastric reflux (heartburn), you may be instructed to take Calcium Citrate with vitamin D instead of the other types of calcium. Your surgeon will instruct you on which type of calcium supplement to purchase and how many tablets to take each day after surgery. ? Take all of your routine medications as prescribed, unless told otherwise by your surgeon. Any medications which thin the blood should be avoided until your surgeon tells you to resume them.  ? IT IS OK TO TAKE OVER THE COUNTER PAIN MEDICATION (IBUPROFEN, NAPROXEN, or ACETAMINOPHEN) IN ADDITION TO YOUR PRESCRIBED MEDICATIONS. DO NOT TAKE ASPIRIN UNLESS CLEARED WITH YOUR SURGEON.  ? Limit Acetaminophen/Tylenol to less than 4,'000mg'$ /day  ? Limit Ibuprofen/Motrin to less than 3,'600mg'$ /day  Pain The main complaint following thyroid surgery is pain with swallowing and neck movement. Some people experience a dull ache, while others feel a sharp pain. This should not keep you from eating anything you want or moving your neck and will improve daily after surgery. It is normal to have a sore throat as well.   Voice Your voice may go through some temporary changes with fluctuations in volume and clarity (hoarseness). Generally, it will be better in the mornings and "tire" toward the end of the day. This can last for variable periods of time, but should clear in 8-10 weeks at most.   Cough You may feel like you have phlegm in your throat or a sore throat. This is usually because there was a tube in your windpipe while you were asleep that caused irritation that you perceive as phlegm. You will notice that if you cough, very little phlegm will come up. This should clear up in 4 to 5 days.  Hypocalcemia (Low blood  calcium) In some patients who have thyroid and parathyroid surgery, the parathyroid glands do not function properly immediately after surgery. This is usually temporary and causes the blood calcium level to drop below normal (hypocalcemia). Symptoms of hypocalcemia include numbness and tingling of your lips (like they fell asleep), in your hands and in your feet. Some patients experience a "crawling" sensation in the skin, muscle cramps or headaches. These symptoms can appear between 24 and 72 hours after surgery. It is rare for them to start more than 72 hours after surgery. If this happens, take 2 extra calcium tablets. If the symptoms do not resolve within one hour, you should call your doctor. If this happens during the business day, call the nurse triage line. If this happens in the evening or over the weekend, call the on call physician or go to the ER so your blood calcium levels can be checked. You can take extra calcium supplements (which will help to resolve symptoms) as you are contacting  the clinic or coming to the ER. Taking extra calcium supplements if you do not need them will not cause you any harm.  Reasons to call your surgeon's office ? Persistent fever over 101 F ? Increasing neck swelling ? Numbness or tingling around your mouth or fingertips  ? Pain that is not relieved by your medications ? Purulent drainage (pus) from the incision ? Redness surrounding the incision that is worsening or getting bigger ? Bleeding is possible after surgery and the most serious cases may cause trouble breathing. Symptoms include rapid swelling in the neck, trouble breathing, red and purple discoloration of the skin over the incision. If breathing difficulty occurs with this rapid neck swelling, call the doctor immediately, go to the closest emergency room or call 911.

## 2022-09-07 NOTE — Progress Notes (Signed)
   ENT Progress Note: s/p Procedure(s): TOTAL THYROIDECTOMY   Subjective: Mild postop pain  Objective: Vital signs in last 24 hours: Temp:  [97.6 F (36.4 C)-98.4 F (36.9 C)] 98 F (36.7 C) (10/11 1445) Pulse Rate:  [69-115] 69 (10/11 1600) Resp:  [9-16] 13 (10/11 1600) BP: (105-147)/(66-103) 116/80 (10/11 1600) SpO2:  [93 %-97 %] 96 % (10/11 1600) Weight:  [87.5 kg] 87.5 kg (10/11 0728) Weight change:     Intake/Output from previous day: No intake/output data recorded. Intake/Output this shift: Total I/O In: 1500 [I.V.:1500] Out: 1000 [Urine:950; Blood:50]  Labs: No results for input(s): "WBC", "HGB", "HCT", "PLT" in the last 72 hours. Recent Labs    09/07/22 1403  CALCIUM 8.8*    Studies/Results: No results found.   PHYSICAL EXAM: Inc intact, no swelling or erythema  JP funtional Normal voice and resp   Assessment/Plan: Pt stable Monitor postop Ca+2     Jerrell Belfast 09/07/2022, 4:32 PM

## 2022-09-07 NOTE — Anesthesia Procedure Notes (Addendum)
Date/Time: 09/07/2022 8:52 AM  Performed by: Audry Pili, MDLaryngoscope Size: Glidescope and 3 Grade View: Grade I Tube type: Oral (NIMS) Tube size: 6.0 mm Number of attempts: 2 (DLx1 CRNA, DLx1 MDA) Airway Equipment and Method: Rigid stylet and Video-laryngoscopy Placement Confirmation: ETT inserted through vocal cords under direct vision, positive ETCO2 and breath sounds checked- equal and bilateral Secured at: 21 cm Tube secured with: Tape Dental Injury: Teeth and Oropharynx as per pre-operative assessment

## 2022-09-07 NOTE — Progress Notes (Signed)
PT just arrived to the unit from PACU, Aox4 pt ambulated x1 assisst to the BR, skin intact, incision looks good JP drain empty.

## 2022-09-07 NOTE — Progress Notes (Signed)
Dr. Fransisco Beau made aware of patient c/o needing to void patient bladder scan showed 832m new order for straight cath

## 2022-09-07 NOTE — H&P (Signed)
Anne Kramer is an 51 y.o. female.    Chief Complaint:  Papillary Thyroid Carcinoma  HPI: Patient presents today for planned elective procedure. She denies any interval change in history since office visit on 05/25/2022:  Anne Kramer is a 51 y.o. female who presents as a return patient, referred by Edward Jolly*, to review results of recent FNA and discuss recommendations. Patient was initially seen in our office by Jolene Provost, PA for foreign body in the ear. During routine exam, patient was noted to have fullness of the thyroid and a thyroid ultrasound was ordered. Ultrasound demonstrated a 3.5 x 1.7 x 2.8 TI-RADS 4 nodule of the isthmus, as well as several other nodules in the left and right thyroid lobes, which did not meet criteria for biopsy due to size. Subsequent FNA was consistent with papillary thyroid carcinoma. Patient denies family history of thyroid cancer. She denies personal history of radiation exposure. She is a non-smoker with no tobacco use history. She endorses history of GERD, and currently takes a daily PPI. She endorses frequent throat clearing and globus sensation   Past Medical History:  Diagnosis Date   Abnormal mammogram    Abnormal Pap smear of cervix    Anemia    hx after hysterectomy -no issues now   Anxiety    Cancer ([place]) 2023   thyroid   Depression    Diabetes mellitus without complication (HCC)    GERD (gastroesophageal reflux disease)    History of hiatal hernia    Hyperlipidemia    PONV (postoperative nausea and vomiting)    Restless leg syndrome     Past Surgical History:  Procedure Laterality Date   ABDOMINAL HYSTERECTOMY  2019   CHOLECYSTECTOMY  2000   COLONOSCOPY  2017   UPPER GASTROINTESTINAL ENDOSCOPY  2017    Family History  Problem Relation Age of Onset   High blood pressure Mother    High Cholesterol Mother    Depression Mother    Anxiety disorder Mother    Atrial fibrillation Mother    Thyroid disease  Mother    High blood pressure Father    Heart disease Father    High Cholesterol Sister    Cancer Sister    Breast cancer Maternal Aunt    Breast cancer Paternal Grandmother     Social History:  reports that she has never smoked. She has never used smokeless tobacco. She reports current alcohol use. She reports that she does not use drugs.  Allergies:  Allergies  Allergen Reactions   Kiwi Extract Swelling    Makes lips swell    Medications Prior to Admission  Medication Sig Dispense Refill   atorvastatin (LIPITOR) 20 MG tablet Take 1 tablet (20 mg total) by mouth daily. 90 tablet 3   buPROPion (WELLBUTRIN) 100 MG tablet Take 1 tablet (100 mg total) by mouth daily. 330 tablet 3   esomeprazole (NEXIUM) 40 MG capsule Take 1 capsule by mouth daily. 90 capsule 3   gabapentin (NEURONTIN) 100 MG capsule Take 1 - 3 capsules by mouth at bedtime. (Patient taking differently: Take 300 mg by mouth at bedtime.) 270 capsule 3   Semaglutide,0.25 or 0.5MG/DOS, (OZEMPIC, 0.25 OR 0.5 MG/DOSE,) 2 MG/3ML SOPN Inject 0.75m into the skin once a week for 4 weeks, then increase to 0.567monce a week as directed. (Patient taking differently: Inject 0.5 mg as directed once a week.) 3 mL 6   venlafaxine XR (EFFEXOR-XR) 150 MG 24 hr capsule Take 1 capsule  by mouth daily with breakfast. 90 capsule 3   blood glucose meter kit and supplies Dispense based on patient and insurance preference. Use up to four times daily as directed. (FOR ICD-10 R73.03, Z68.38) 1 each 0   ondansetron (ZOFRAN) 4 MG tablet Take 1 tablet (4 mg total) by mouth every 8 (eight) hours as needed for nausea or vomiting. (Patient not taking: Reported on 08/25/2022) 20 tablet 0   pramipexole (MIRAPEX) 0.125 MG tablet Take 1 tablet (0.125 mg total) by mouth at bedtime. (Patient not taking: Reported on 08/25/2022) 30 tablet 5    Results for orders placed or performed during the hospital encounter of 09/07/22 (from the past 48 hour(s))  Glucose,  capillary     Status: None   Collection Time: 09/07/22  7:31 AM  Result Value Ref Range   Glucose-Capillary 98 70 - 99 mg/dL    Comment: Glucose reference range applies only to samples taken after fasting for at least 8 hours.   No results found.  ROS: ROS  Blood pressure (!) 141/96, pulse 70, temperature 98.4 F (36.9 C), temperature source Oral, resp. rate 16, height 5' 5"  (1.651 m), weight 87.5 kg.  PHYSICAL EXAM: Physical Exam Constitutional:      Appearance: Normal appearance.  HENT:     Right Ear: External ear normal.     Left Ear: External ear normal.     Mouth/Throat:     Mouth: Mucous membranes are moist.  Pulmonary:     Effort: Pulmonary effort is normal.  Neurological:     General: No focal deficit present.     Mental Status: She is alert.  Psychiatric:        Mood and Affect: Mood normal.        Behavior: Behavior normal.     Studies Reviewed: Thyroid US and FNA   Assessment/Plan Anne Kramer is a 51 y.o. female with 3.5 x 1.7 x 2.8 TI-RADS 4 nodule of the isthmus, with papillary thyroid carcinoma noted on biopsy. -To OR today for total thyroidectomy. Risks of surgery were reviewed as well as expected postop course. All questions were answered.   Davis Ambrosini A Alexea Blase 09/07/2022, 8:28 AM

## 2022-09-07 NOTE — Anesthesia Postprocedure Evaluation (Signed)
Anesthesia Post Note  Patient: Anne Kramer  Procedure(s) Performed: TOTAL THYROIDECTOMY (Bilateral: Neck)     Patient location during evaluation: PACU Anesthesia Type: General Level of consciousness: awake and alert Pain management: pain level controlled Vital Signs Assessment: post-procedure vital signs reviewed and stable Respiratory status: spontaneous breathing, nonlabored ventilation and respiratory function stable Cardiovascular status: blood pressure returned to baseline and stable Postop Assessment: no apparent nausea or vomiting Anesthetic complications: no   No notable events documented.  Last Vitals:  Vitals:   09/07/22 1445 09/07/22 1500  BP: 105/66 110/68  Pulse: 87 85  Resp: 12 11  Temp: 36.7 C   SpO2: 94% 95%    Last Pain:  Vitals:   09/07/22 1445  TempSrc:   PainSc: [place]

## 2022-09-08 ENCOUNTER — Encounter (HOSPITAL_COMMUNITY): Payer: Self-pay | Admitting: Otolaryngology

## 2022-09-08 ENCOUNTER — Other Ambulatory Visit (HOSPITAL_COMMUNITY): Payer: Self-pay

## 2022-09-08 DIAGNOSIS — C73 Malignant neoplasm of thyroid gland: Secondary | ICD-10-CM | POA: Diagnosis not present

## 2022-09-08 LAB — GLUCOSE, CAPILLARY
Glucose-Capillary: 94 mg/dL (ref 70–99)
Glucose-Capillary: 113 mg/dL — ABNORMAL HIGH (ref 70–99)

## 2022-09-08 LAB — PARATHYROID HORMONE, INTACT (NO CA): PTH: 19 pg/mL (ref 15–65)

## 2022-09-08 LAB — CALCIUM
Calcium: 8.4 mg/dL — ABNORMAL LOW (ref 8.9–10.3)
Calcium: 8.6 mg/dL — ABNORMAL LOW (ref 8.9–10.3)

## 2022-09-08 LAB — ALBUMIN
Albumin: 3.4 g/dL — ABNORMAL LOW (ref 3.5–5.0)
Albumin: 3.3 g/dL — ABNORMAL LOW (ref 3.5–5.0)

## 2022-09-08 MED ORDER — LEVOTHYROXINE SODIUM 150 MCG PO TABS
150.0000 ug | ORAL_TABLET | Freq: Every day | ORAL | 1 refills | Status: DC
  Filled 2022-09-08: qty 90, 90d supply, fill #0

## 2022-09-08 MED ORDER — DOCUSATE SODIUM 100 MG PO CAPS
100.0000 mg | ORAL_CAPSULE | Freq: Two times a day (BID) | ORAL | 0 refills | Status: DC
  Filled 2022-09-08: qty 10, 5d supply, fill #0

## 2022-09-08 MED ORDER — HYDROCODONE-ACETAMINOPHEN 5-325 MG PO TABS
1.0000 | ORAL_TABLET | Freq: Four times a day (QID) | ORAL | 0 refills | Status: AC | PRN
  Filled 2022-09-08: qty 20, 5d supply, fill #0

## 2022-09-08 NOTE — Plan of Care (Signed)
  Problem: Education: Goal: Knowledge of the prescribed therapeutic regimen will improve Outcome: Adequate for Discharge   Problem: Activity: Goal: Ability to tolerate increased activity will improve Outcome: Adequate for Discharge   Problem: Health Behavior/Discharge Planning: Goal: Identification of resources available to assist in meeting health care needs will improve Outcome: Adequate for Discharge   Problem: Nutrition: Goal: Maintenance of adequate nutrition will improve Outcome: Adequate for Discharge   Problem: Clinical Measurements: Goal: Complications related to the disease process, condition or treatment will be avoided or minimized Outcome: Adequate for Discharge   Problem: Respiratory: Goal: Will regain and/or maintain adequate ventilation Outcome: Adequate for Discharge   Problem: Skin Integrity: Goal: Demonstration of wound healing without infection will improve Outcome: Adequate for Discharge   Problem: Education: Goal: Knowledge of General Education information will improve Description: Including pain rating scale, medication(s)/side effects and non-pharmacologic comfort measures Outcome: Adequate for Discharge   Problem: Health Behavior/Discharge Planning: Goal: Ability to manage health-related needs will improve Outcome: Adequate for Discharge   Problem: Clinical Measurements: Goal: Ability to maintain clinical measurements within normal limits will improve Outcome: Adequate for Discharge Goal: Will remain free from infection Outcome: Adequate for Discharge Goal: Diagnostic test results will improve Outcome: Adequate for Discharge Goal: Respiratory complications will improve Outcome: Adequate for Discharge Goal: Cardiovascular complication will be avoided Outcome: Adequate for Discharge   Problem: Activity: Goal: Risk for activity intolerance will decrease Outcome: Adequate for Discharge   Problem: Nutrition: Goal: Adequate nutrition will be  maintained Outcome: Adequate for Discharge   Problem: Coping: Goal: Level of anxiety will decrease Outcome: Adequate for Discharge   Problem: Elimination: Goal: Will not experience complications related to bowel motility Outcome: Adequate for Discharge Goal: Will not experience complications related to urinary retention Outcome: Adequate for Discharge   Problem: Pain Managment: Goal: General experience of comfort will improve Outcome: Adequate for Discharge   Problem: Safety: Goal: Ability to remain free from injury will improve Outcome: Adequate for Discharge   Problem: Skin Integrity: Goal: Risk for impaired skin integrity will decrease Outcome: Adequate for Discharge   

## 2022-09-08 NOTE — Discharge Summary (Signed)
Physician Discharge Summary  Patient ID: Anne Kramer MRN: [id number] DOB/AGE: 05-14-1971 51 y.o.  Admit date: 09/07/2022 Discharge date: 09/08/2022  Admission Diagnoses:  Principal Problem:   Papillary thyroid carcinoma [facility])   Discharge Diagnoses:  Same  Surgeries: Procedure(s): TOTAL THYROIDECTOMY on 09/07/2022   Consultants: None  Discharged Condition: Improved  Hospital Course: Anne Kramer is an 51 y.o. female who was admitted 09/07/2022 with a diagnosis of Papillary thyroid carcinoma ([place]).  They were brought to the operating room on 09/07/2022 and underwent the above named procedures.  Patient was admitted for routine overnight observation. On POD #1, patient was tolerating PO, ambulating, with adequate control of pain and normal labs. Drain output was noted to be diminishing and drain was removed.  Physical Exam:  General: Awake and alert, no acute distress Neck: Midline neck incision C/D/I. JP drain with SS drainage, removed. No evidence of hematoma or seroma Respiratory: Respiratory effort is normal.   Recent vital signs:  Vitals:   09/08/22 0812 09/08/22 0838  BP: 128/73 (!) 173/102  Pulse: (!) 59 73  Resp: 17 18  Temp: 97.9 F (36.6 C) 97.6 F (36.4 C)  SpO2: 97% 98%    Recent laboratory studies:  Results for orders placed or performed during the hospital encounter of 09/07/22  Glucose, capillary  Result Value Ref Range   Glucose-Capillary 98 70 - 99 mg/dL  Glucose, capillary  Result Value Ref Range   Glucose-Capillary 113 (H) 70 - 99 mg/dL  Calcium  Result Value Ref Range   Calcium 8.8 (L) 8.9 - 10.3 mg/dL  Parathyroid hormone, intact (no Ca)  Result Value Ref Range   PTH 19 15 - 65 pg/mL  Magnesium  Result Value Ref Range   Magnesium 1.7 1.7 - 2.4 mg/dL  Albumin  Result Value Ref Range   Albumin 3.5 3.5 - 5.0 g/dL  Calcium  Result Value Ref Range   Calcium 8.8 (L) 8.9 - 10.3 mg/dL  Albumin  Result Value Ref Range   Albumin  3.5 3.5 - 5.0 g/dL  Glucose, capillary  Result Value Ref Range   Glucose-Capillary 118 (H) 70 - 99 mg/dL  Calcium  Result Value Ref Range   Calcium 8.4 (L) 8.9 - 10.3 mg/dL  Albumin  Result Value Ref Range   Albumin 3.4 (L) 3.5 - 5.0 g/dL  Glucose, capillary  Result Value Ref Range   Glucose-Capillary 117 (H) 70 - 99 mg/dL  Calcium  Result Value Ref Range   Calcium 8.6 (L) 8.9 - 10.3 mg/dL  Albumin  Result Value Ref Range   Albumin 3.3 (L) 3.5 - 5.0 g/dL  Glucose, capillary  Result Value Ref Range   Glucose-Capillary 94 70 - 99 mg/dL  Glucose, capillary  Result Value Ref Range   Glucose-Capillary 113 (H) 70 - 99 mg/dL    Discharge Medications:   Allergies as of 09/08/2022       Reactions   Kiwi Extract Swelling   Makes lips swell        Medication List     TAKE these medications    atorvastatin 20 MG tablet Commonly known as: LIPITOR Take 1 tablet (20 mg total) by mouth daily.   blood glucose meter kit and supplies Dispense based on patient and insurance preference. Use up to four times daily as directed. (FOR ICD-10 R73.03, Z68.38)   buPROPion 100 MG tablet Commonly known as: WELLBUTRIN Take 1 tablet (100 mg total) by mouth daily.   docusate sodium 100 MG capsule  Commonly known as: COLACE Take 1 capsule (100 mg total) by mouth 2 (two) times daily.   esomeprazole 40 MG capsule Commonly known as: NEXIUM Take 1 capsule by mouth daily.   gabapentin 100 MG capsule Commonly known as: NEURONTIN Take 1 - 3 capsules by mouth at bedtime. What changed: how much to take   HYDROcodone-acetaminophen 5-325 MG tablet Commonly known as: NORCO/VICODIN Take 1 tablet by mouth every 6 (six) hours as needed for up to 5 days for moderate pain.   levothyroxine 150 MCG tablet Commonly known as: SYNTHROID Take 1 tablet (150 mcg total) by mouth daily at 6 (six) AM. Start taking on: September 09, 2022   ondansetron 4 MG tablet Commonly known as: Zofran Take 1 tablet (4  mg total) by mouth every 8 (eight) hours as needed for nausea or vomiting.   Ozempic (0.25 or 0.5 MG/DOSE) 2 MG/3ML Sopn Generic drug: Semaglutide(0.25 or 0.5MG/DOS) Inject 0.22m into the skin once a week for 4 weeks, then increase to 0.56monce a week as directed. What changed:  how much to take how to take this when to take this   pramipexole 0.125 MG tablet Commonly known as: MIRAPEX Take 1 tablet (0.125 mg total) by mouth at bedtime.   venlafaxine XR 150 MG 24 hr capsule Commonly known as: EFFEXOR-XR Take 1 capsule by mouth daily with breakfast.        Diagnostic Studies: No results found.  Disposition: Discharge disposition: 01-Home or Self Care          Follow-up Information     Kenyatta Keidel A, DO. Go on 09/22/2022.   Specialty: Otolaryngology Why: Follow up as scheduled on 09/22/2022 Contact information: [address] [place] [phone]                  Signed: MeFranciso Bendkotnicki 09/08/2022, 12:59 PM

## 2022-09-09 ENCOUNTER — Other Ambulatory Visit (HOSPITAL_COMMUNITY): Payer: Self-pay

## 2022-09-15 ENCOUNTER — Other Ambulatory Visit (HOSPITAL_COMMUNITY): Payer: Self-pay

## 2022-09-15 LAB — SURGICAL PATHOLOGY

## 2022-09-22 DIAGNOSIS — Z08 Encounter for follow-up examination after completed treatment for malignant neoplasm: Secondary | ICD-10-CM | POA: Diagnosis not present

## 2022-09-29 DIAGNOSIS — E890 Postprocedural hypothyroidism: Secondary | ICD-10-CM | POA: Insufficient documentation

## 2022-09-29 DIAGNOSIS — Z79890 Hormone replacement therapy (postmenopausal): Secondary | ICD-10-CM | POA: Diagnosis not present

## 2022-09-29 DIAGNOSIS — C73 Malignant neoplasm of thyroid gland: Secondary | ICD-10-CM | POA: Diagnosis not present

## 2022-10-12 ENCOUNTER — Other Ambulatory Visit (HOSPITAL_COMMUNITY): Payer: Self-pay

## 2022-10-17 DIAGNOSIS — E890 Postprocedural hypothyroidism: Secondary | ICD-10-CM | POA: Diagnosis not present

## 2022-10-17 DIAGNOSIS — E042 Nontoxic multinodular goiter: Secondary | ICD-10-CM | POA: Diagnosis not present

## 2022-10-17 DIAGNOSIS — C73 Malignant neoplasm of thyroid gland: Secondary | ICD-10-CM | POA: Diagnosis not present

## 2022-11-07 ENCOUNTER — Other Ambulatory Visit (HOSPITAL_COMMUNITY): Payer: Self-pay

## 2022-11-13 ENCOUNTER — Ambulatory Visit
Admission: EM | Admit: 2022-11-13 | Discharge: 2022-11-13 | Disposition: A | Discharge status: Home / Self Care | Payer: 59 | Attending: Family Medicine | Admitting: Family Medicine

## 2022-11-13 DIAGNOSIS — N390 Urinary tract infection, site not specified: Secondary | ICD-10-CM | POA: Diagnosis not present

## 2022-11-13 LAB — POCT URINALYSIS DIP (MANUAL ENTRY)
Glucose, UA: NEGATIVE mg/dL
Nitrite, UA: POSITIVE — AB
Protein Ur, POC: 30 mg/dL — AB
Spec Grav, UA: >=1.03 — AB (ref 1.010–1.025)
Urobilinogen, UA: 0.2 E.U./dL
pH, UA: 5.5 (ref 5.0–8.0)

## 2022-11-13 MED ORDER — SULFAMETHOXAZOLE-TRIMETHOPRIM 800-160 MG PO TABS: 1.0000 | ORAL_TABLET | Freq: Two times a day (BID) | ORAL | 0 refills | Status: AC

## 2022-11-13 MED ORDER — PHENAZOPYRIDINE HCL 200 MG PO TABS: 200.0000 mg | ORAL_TABLET | Freq: Three times a day (TID) | ORAL | 0 refills | Status: DC | PRN

## 2022-11-13 NOTE — ED Provider Notes (Addendum)
[facility]    CSN: [id number] Arrival date & time: 11/13/22  1117      History   Chief Complaint No chief complaint on file.   HPI Anne Kramer is a 51 y.o. female.   Presenting today with 1 day history of dysuria, urinary frequency and urgency, suprapubic pressure.  Denies hematuria, fever, chills, nausea, vomiting, vaginal symptoms, concern for STDs.  So far not trying anything over-the-counter for symptoms other than drinking plenty of water.    Past Medical History:  Diagnosis Date   Abnormal mammogram    Abnormal Pap smear of cervix    Anemia    hx after hysterectomy -no issues now   Anxiety    Cancer ([place]) 2023   thyroid   Depression    Diabetes mellitus without complication (HCC)    GERD (gastroesophageal reflux disease)    History of hiatal hernia    Hyperlipidemia    PONV (postoperative nausea and vomiting)    Restless leg syndrome     Patient Active Problem List   Diagnosis Date Noted   Papillary thyroid carcinoma ([place]) 09/07/2022   Thyromegaly 04/18/2022   Foreign body of right ear 04/18/2022   Non-restorative sleep 12/06/2021   Class 2 obesity due to excess calories without serious comorbidity with body mass index (BMI) of 36.0 to 36.9 in adult 12/06/2021   Snoring 12/06/2021   Nocturia more than twice per night 12/06/2021   Elbow pain, right 11/09/2021   Thumb pain, right 11/09/2021   Pre-diabetes 05/25/2021   RLS (restless legs syndrome) 05/06/2021   Encounter to establish care 05/06/2021   Anxiety and depression 03/12/2020   BMI 38.0-38.9,adult 03/12/2020   Gastroesophageal reflux disease without esophagitis 03/12/2020   Other hyperlipidemia 03/12/2020    Past Surgical History:  Procedure Laterality Date   ABDOMINAL HYSTERECTOMY  2019   CHOLECYSTECTOMY  2000   COLONOSCOPY  2017   THYROIDECTOMY Bilateral 09/07/2022   Procedure: TOTAL THYROIDECTOMY;  Surgeon: Jason Coop, DO;  Location: [place];  Service: ENT;   Laterality: Bilateral;   UPPER GASTROINTESTINAL ENDOSCOPY  2017    OB History   No obstetric history on file.      Home Medications    Prior to Admission medications   Medication Sig Start Date End Date Taking? Authorizing Provider  phenazopyridine (PYRIDIUM) 200 MG tablet Take 1 tablet (200 mg total) by mouth 3 (three) times daily as needed for pain. 11/13/22  Yes Volney American, PA-C  sulfamethoxazole-trimethoprim (BACTRIM DS) 800-160 MG tablet Take 1 tablet by mouth 2 (two) times daily for 3 days. 11/13/22 11/16/22 Yes Volney American, PA-C  atorvastatin (LIPITOR) 20 MG tablet Take 1 tablet (20 mg total) by mouth daily. 05/12/22   Orma Render, NP  blood glucose meter kit and supplies Dispense based on patient and insurance preference. Use up to four times daily as directed. (FOR ICD-10 R73.03, Z68.38) 05/25/21   Early, Coralee Pesa, NP  buPROPion (WELLBUTRIN) 100 MG tablet Take 1 tablet (100 mg total) by mouth daily. 05/12/22   Orma Render, NP  docusate sodium (COLACE) 100 MG capsule Take 1 capsule (100 mg total) by mouth 2 (two) times daily. 09/08/22   Skotnicki, Meghan A, DO  esomeprazole (NEXIUM) 40 MG capsule Take 1 capsule by mouth daily. 08/29/22   Orma Render, NP  gabapentin (NEURONTIN) 100 MG capsule Take 1 - 3 capsules by mouth at bedtime. Patient taking differently: Take 300 mg by mouth at bedtime.  05/12/22   Orma Render, NP  levothyroxine (SYNTHROID) 150 MCG tablet Take 1 tablet (150 mcg total) by mouth daily at 6 (six) AM. 09/09/22   Skotnicki, Meghan A, DO  ondansetron (ZOFRAN) 4 MG tablet Take 1 tablet (4 mg total) by mouth every 8 (eight) hours as needed for nausea or vomiting. Patient not taking: Reported on 08/25/2022 11/09/21   Orma Render, NP  pramipexole (MIRAPEX) 0.125 MG tablet Take 1 tablet (0.125 mg total) by mouth at bedtime. Patient not taking: Reported on 08/25/2022 12/06/21   Dohmeier, Asencion Partridge, MD  Semaglutide,0.25 or 0.5MG/DOS, (OZEMPIC, 0.25 OR  0.5 MG/DOSE,) 2 MG/3ML SOPN Inject 0.56m into the skin once a week for 4 weeks, then increase to 0.580monce a week as directed. Patient taking differently: Inject 0.5 mg as directed once a week. 05/12/22   EaOrma RenderNP  venlafaxine XR (EFFEXOR-XR) 150 MG 24 hr capsule Take 1 capsule by mouth daily with breakfast. 05/12/22   Early, SaCoralee PesaNP    Family History Family History  Problem Relation Age of Onset   High blood pressure Mother    High Cholesterol Mother    Depression Mother    Anxiety disorder Mother    Atrial fibrillation Mother    Thyroid disease Mother    High blood pressure Father    Heart disease Father    High Cholesterol Sister    Cancer Sister    Breast cancer Maternal Aunt    Breast cancer Paternal Grandmother     Social History Social History   Tobacco Use   Smoking status: Never   Smokeless tobacco: Never  Vaping Use   Vaping Use: Never used  Substance Use Topics   Alcohol use: Yes    Comment: once a month   Drug use: Never     Allergies   Kiwi extract   Review of Systems Review of Systems Per HPI  Physical Exam Triage Vital Signs ED Triage Vitals  Enc Vitals Group     BP      Pulse      Resp      Temp      Temp src      SpO2      Weight      Height      Head Circumference      Peak Flow      Pain Score      Pain Loc      Pain Edu?      Excl. in [place]   No data found.  Updated Vital Signs BP 107/80 (BP Location: Right Arm)   Pulse 100   Temp 98.1 F (36.7 C) (Oral)   Resp 20   SpO2 91%   Visual Acuity Right Eye Distance:   Left Eye Distance:   Bilateral Distance:    Right Eye Near:   Left Eye Near:    Bilateral Near:     Physical Exam Vitals and nursing note reviewed.  Constitutional:      Appearance: Normal appearance. She is not ill-appearing.  HENT:     Head: Atraumatic.     Mouth/Throat:     Mouth: Mucous membranes are moist.  Eyes:     Extraocular Movements: Extraocular movements intact.      Conjunctiva/sclera: Conjunctivae normal.  Cardiovascular:     Rate and Rhythm: Normal rate and regular rhythm.     Heart sounds: Normal heart sounds.  Pulmonary:     Effort: Pulmonary effort  is normal.     Breath sounds: Normal breath sounds.  Abdominal:     General: Bowel sounds are normal. There is no distension.     Palpations: Abdomen is soft.     Tenderness: There is no abdominal tenderness. There is no right CVA tenderness, left CVA tenderness or guarding.  Musculoskeletal:        General: Normal range of motion.     Cervical back: Normal range of motion and neck supple.  Skin:    General: Skin is warm and dry.  Neurological:     Mental Status: She is alert and oriented to person, place, and time.  Psychiatric:        Mood and Affect: Mood normal.        Thought Content: Thought content normal.        Judgment: Judgment normal.      UC Treatments / Results  Labs (all labs ordered are listed, but only abnormal results are displayed) Labs Reviewed  POCT URINALYSIS DIP (MANUAL ENTRY) - Abnormal; Notable for the following components:      Result Value   Bilirubin, UA small (*)    Ketones, POC UA trace (5) (*)    Spec Grav, UA >=1.030 (*)    Blood, UA small (*)    Protein Ur, POC =30 (*)    Nitrite, UA Positive (*)    Leukocytes, UA Trace (*)    All other components within normal limits  URINE CULTURE    EKG   Radiology No results found.  Procedures Procedures (including critical care time)  Medications Ordered in UC Medications - No data to display  Initial Impression / Assessment and Plan / UC Course  I have reviewed the triage vital signs and the nursing notes.  Pertinent labs & imaging results that were available during my care of the patient were reviewed by me and considered in my medical decision making (see chart for details).     Vitals and exam overall reassuring, urinalysis with evidence of urinary tract infection.  Urine culture pending,  treat with Bactrim, pre-DM, fluids and adjust if needed based on culture.  Return for worsening symptoms.  Final Clinical Impressions(s) / UC Diagnoses   Final diagnoses:  Acute lower UTI   Discharge Instructions   None    ED Prescriptions     Medication Sig Dispense Auth. Provider   sulfamethoxazole-trimethoprim (BACTRIM DS) 800-160 MG tablet Take 1 tablet by mouth 2 (two) times daily for 3 days. 6 tablet Volney American, [place]   phenazopyridine (PYRIDIUM) 200 MG tablet Take 1 tablet (200 mg total) by mouth 3 (three) times daily as needed for pain. 9 tablet Volney American, [place]      PDMP not reviewed this encounter.   Volney American, PA-C 11/13/22 1231    Volney American, [place] 11/13/22 1240

## 2022-11-15 LAB — URINE CULTURE: Culture: >=100000 — AB

## 2022-11-24 ENCOUNTER — Other Ambulatory Visit (HOSPITAL_COMMUNITY): Payer: Self-pay

## 2022-11-24 DIAGNOSIS — E890 Postprocedural hypothyroidism: Secondary | ICD-10-CM | POA: Diagnosis not present

## 2022-11-25 IMAGING — MG MM DIGITAL SCREENING BILAT W/ TOMO AND CAD
6 of 10 series · 6 of 30 positions shown · non-contrast
Comparison: Previous exam(s).

CLINICAL DATA: Screening.

EXAM:
DIGITAL SCREENING BILATERAL MAMMOGRAM WITH TOMOSYNTHESIS AND CAD
TECHNIQUE: Bilateral screening digital craniocaudal and mediolateral oblique
mammograms were obtained. Bilateral screening digital breast
tomosynthesis was performed. The images were evaluated with
computer-aided detection.

[R MLO synth-2D (1 of 2)]
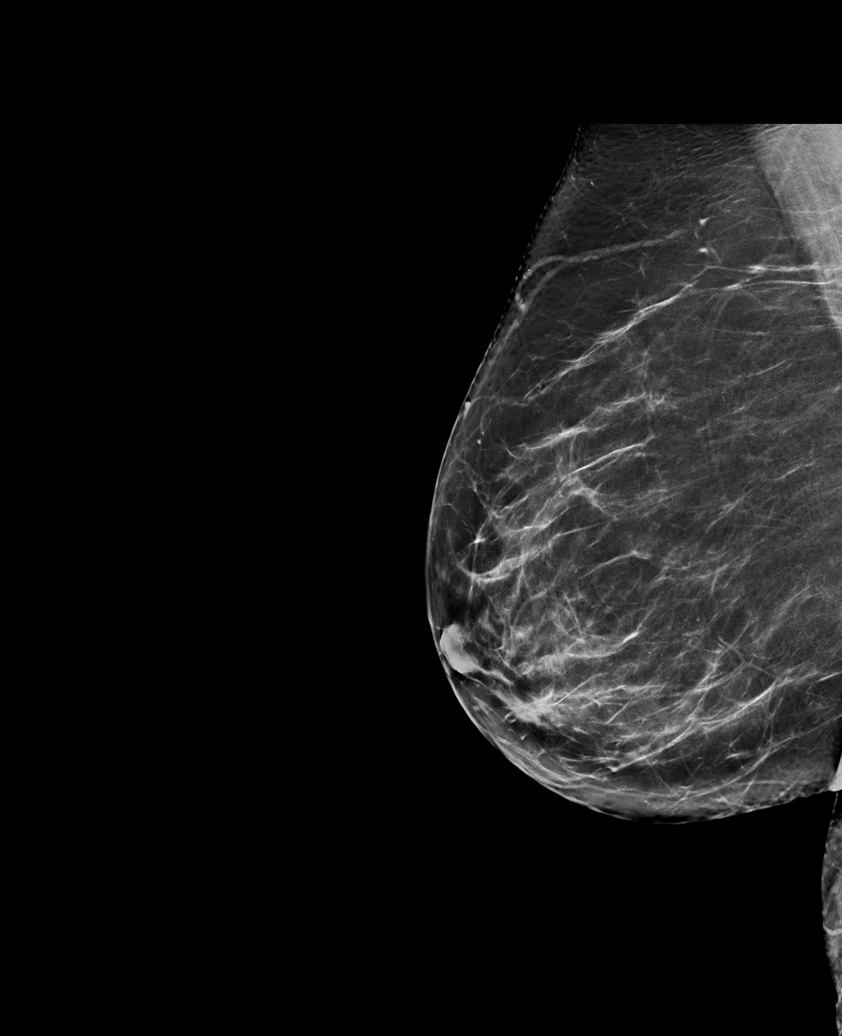

[R CC synth-2D]
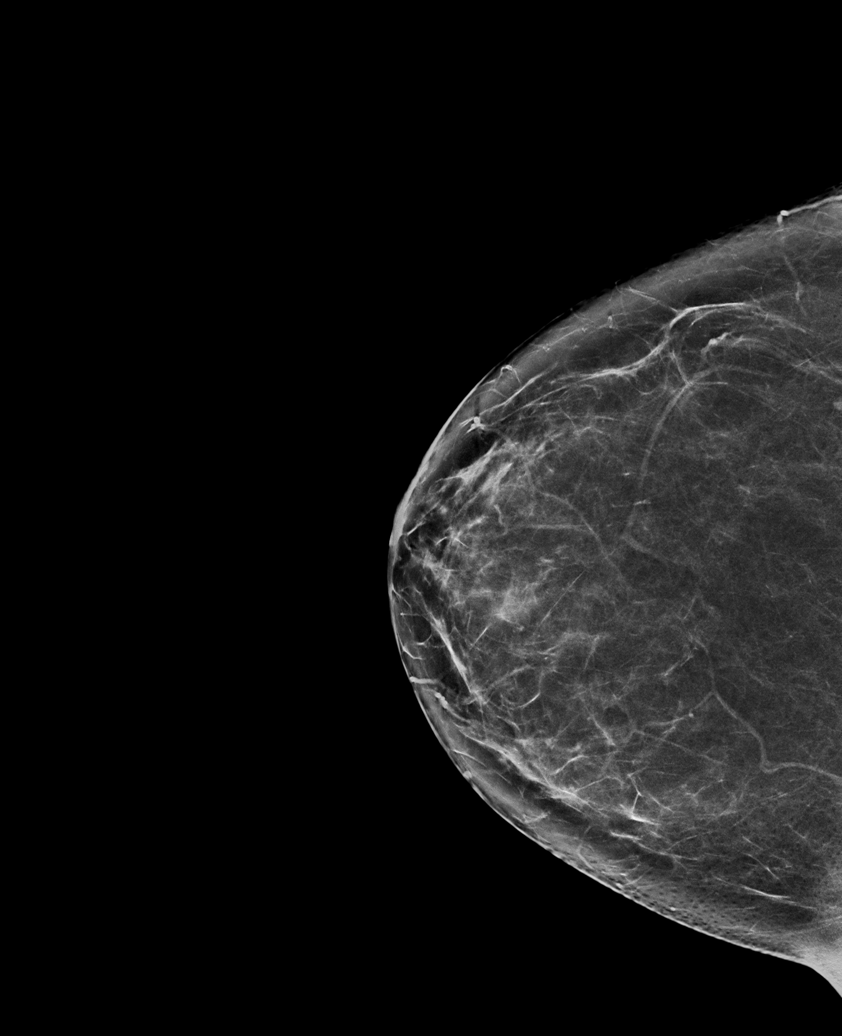

[L CC synth-2D]
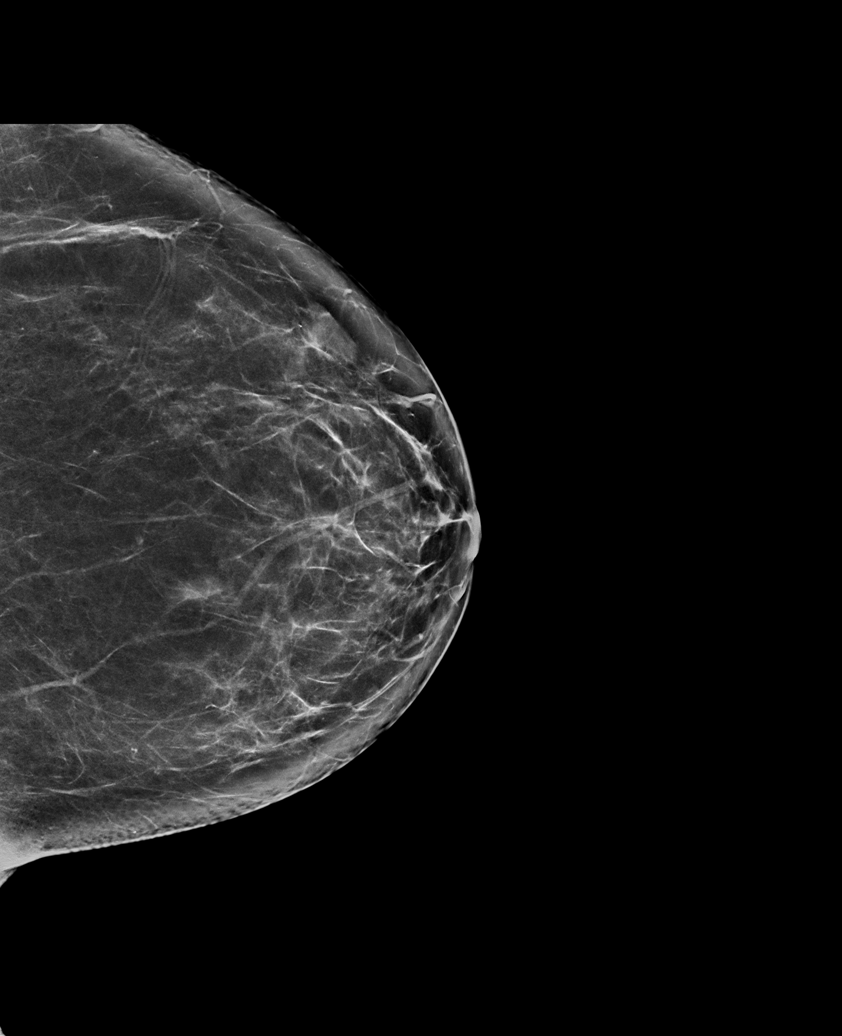

[L MLO synth-2D]
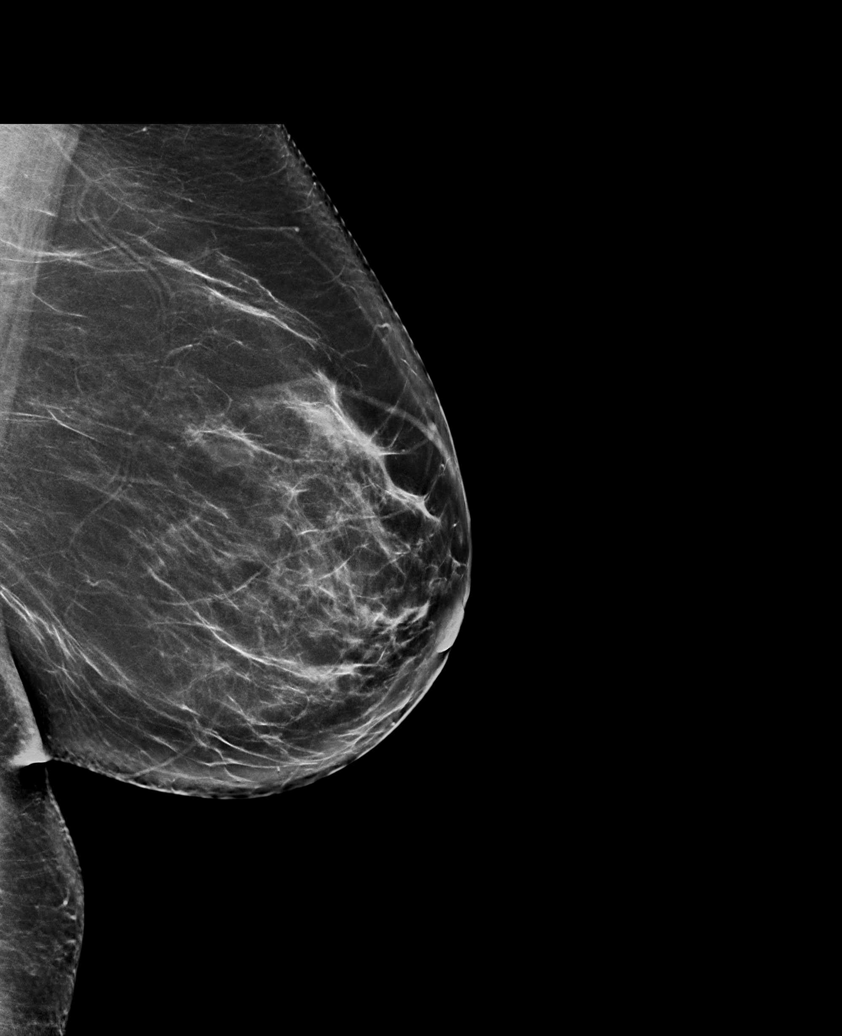

[R MLO synth-2D (2 of 2)]
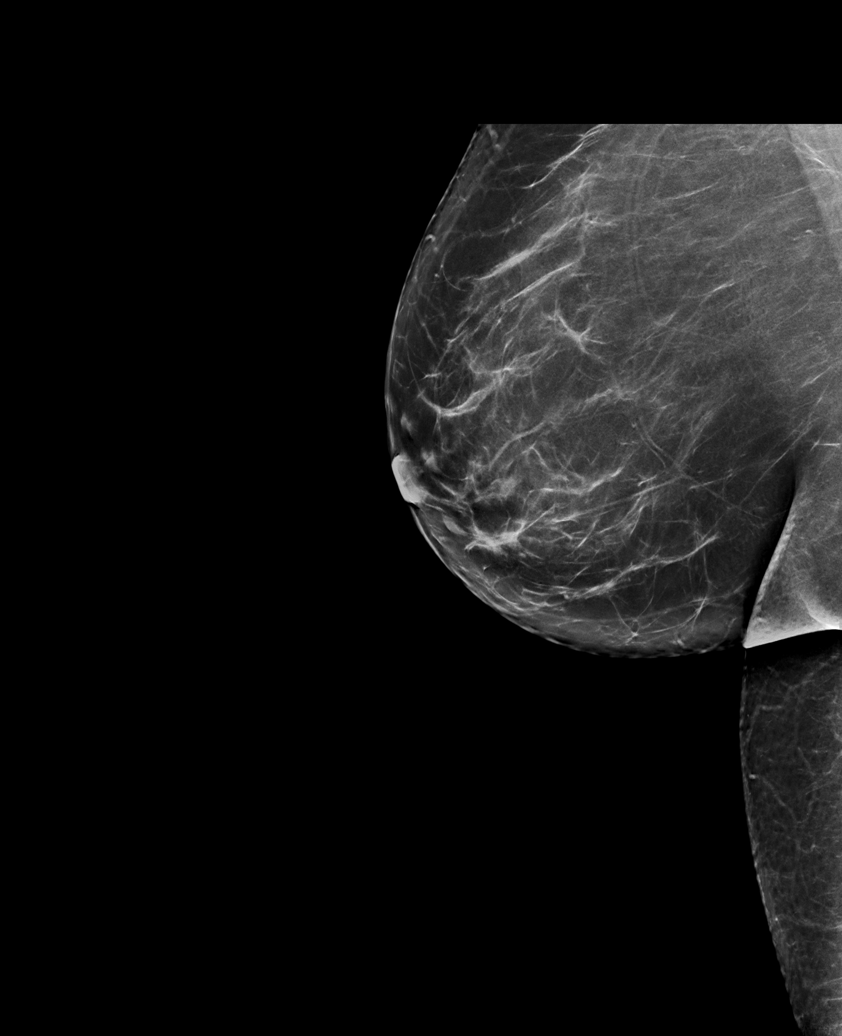

[R MLO tomo · tomo slice 40/79.0]
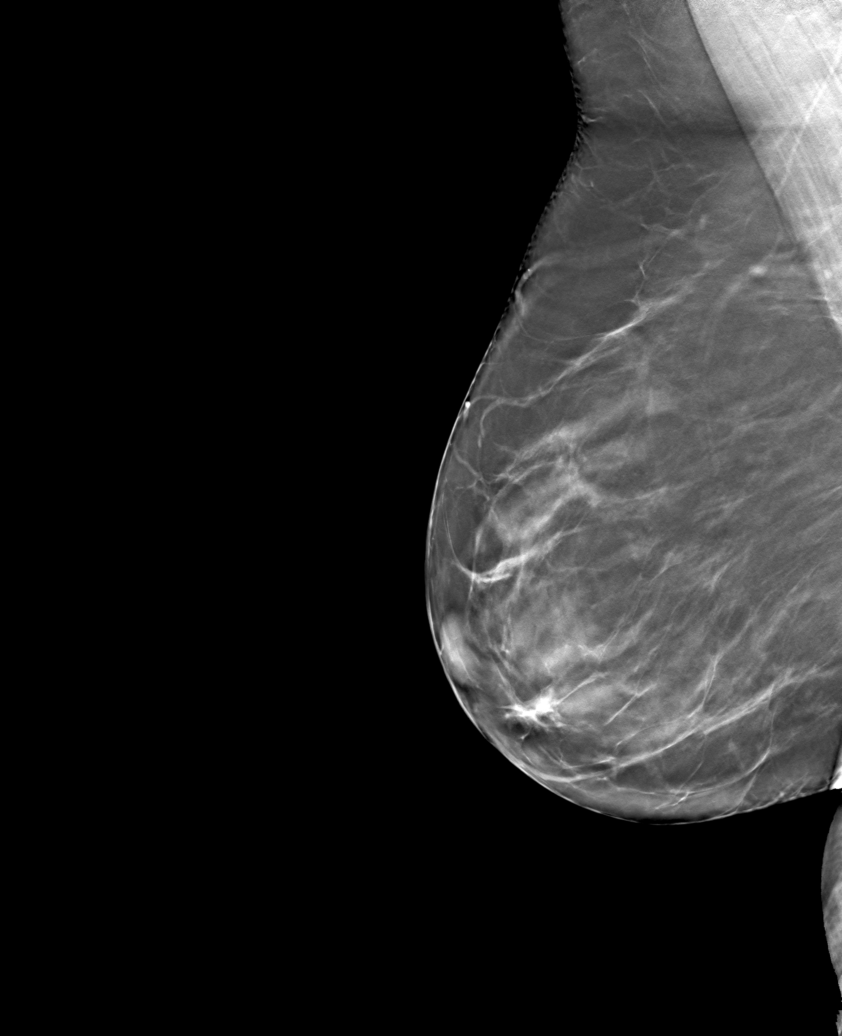

[6 of 30 positions shown; findings below may reference images not displayed]

ACR Breast Density Category b: There are scattered areas of
fibroglandular density.
FINDINGS: There are no findings suspicious for malignancy.
IMPRESSION: No mammographic evidence of malignancy. A result letter of this
screening mammogram will be mailed directly to the patient.

RECOMMENDATION:
Screening mammogram in one year. (Code:51-O-LD2)

BI-RADS CATEGORY  1: Negative.

## 2022-11-29 ENCOUNTER — Other Ambulatory Visit: Payer: Self-pay

## 2022-12-05 ENCOUNTER — Other Ambulatory Visit: Payer: Self-pay

## 2022-12-08 ENCOUNTER — Other Ambulatory Visit (HOSPITAL_BASED_OUTPATIENT_CLINIC_OR_DEPARTMENT_OTHER): Payer: Self-pay | Admitting: Nurse Practitioner

## 2022-12-08 DIAGNOSIS — E119 Type 2 diabetes mellitus without complications: Secondary | ICD-10-CM

## 2022-12-09 ENCOUNTER — Other Ambulatory Visit: Payer: Self-pay

## 2022-12-09 ENCOUNTER — Other Ambulatory Visit (HOSPITAL_COMMUNITY): Payer: Self-pay

## 2022-12-09 MED ORDER — OZEMPIC (0.25 OR 0.5 MG/DOSE) 2 MG/3ML ~~LOC~~ SOPN
PEN_INJECTOR | SUBCUTANEOUS | 6 refills | Status: DC
  Filled 2022-12-09: qty 3, 28d supply, fill #0
  Filled 2023-01-02: qty 3, 28d supply, fill #1
  Filled 2023-01-31: qty 3, 28d supply, fill #2
  Filled 2023-02-28: qty 3, 28d supply, fill #3
  Filled 2023-03-23: qty 3, 28d supply, fill #4
  Filled 2023-04-18: qty 3, 28d supply, fill #5
  Filled 2023-05-18: qty 3, 28d supply, fill #6

## 2022-12-10 ENCOUNTER — Other Ambulatory Visit (HOSPITAL_BASED_OUTPATIENT_CLINIC_OR_DEPARTMENT_OTHER): Payer: Self-pay

## 2022-12-14 IMAGING — US US FNA BIOPSY THYROID 1ST LESION
1 series · 11 of 11 positions shown · non-contrast
Comparison: None Available.

MEDICATIONS:
Lidocaine 1% 2 mL

COMPLICATIONS:
None immediate.

INDICATION: Indeterminate thyroid nodule

EXAM:
ULTRASOUND GUIDED FINE NEEDLE ASPIRATION OF INDETERMINATE THYROID
NODULE
TECHNIQUE: Informed written consent was obtained from the patient after a
discussion of the risks, benefits and alternatives to treatment.
Questions regarding the procedure were encouraged and answered. A
timeout was performed prior to the initiation of the procedure.

[Series 1: us fna biopsy thyroid 1st lesion · 0.05mm/px · 11 acquisitions, 11 frames shown]
[im 1/11]
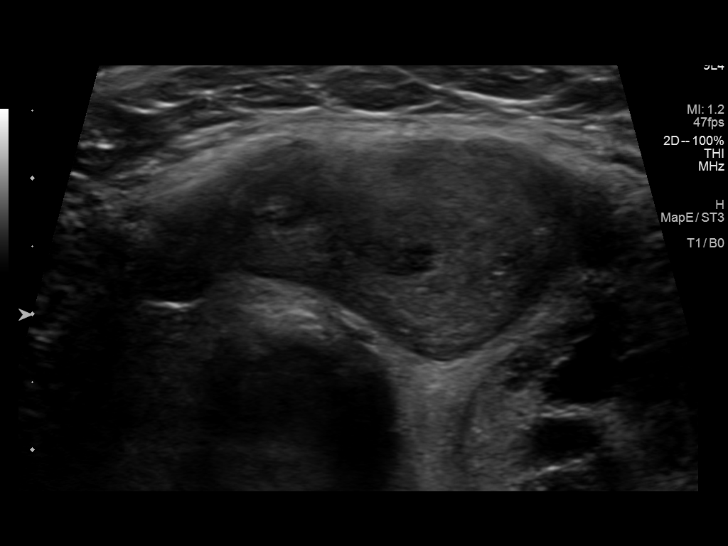
[im 2/11]
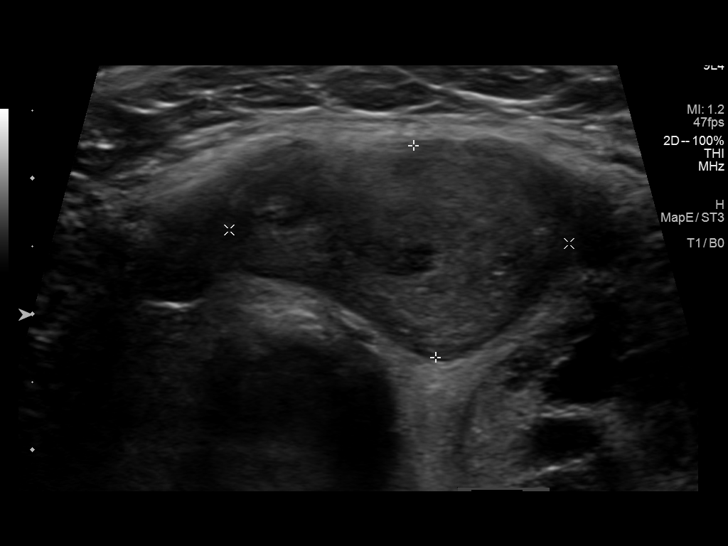
[im 3/11]
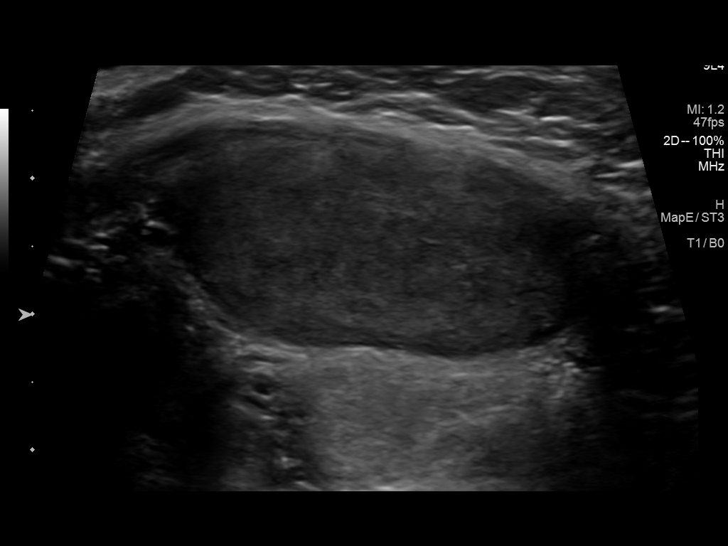
[im 4/11]
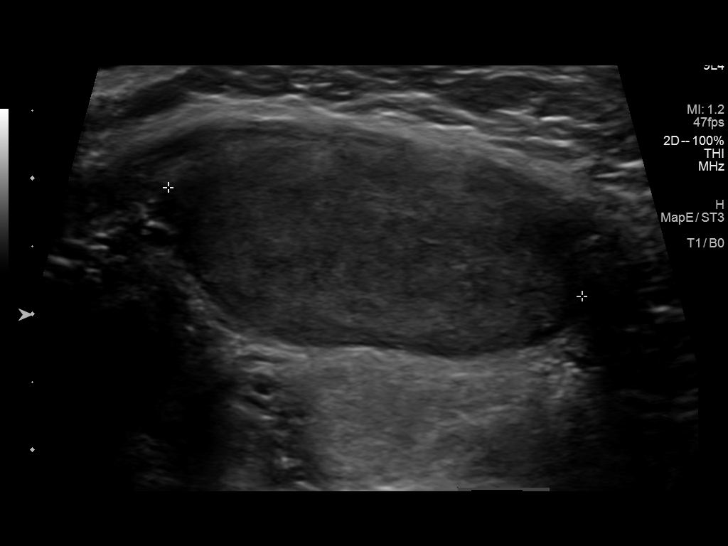
[im 5/11]
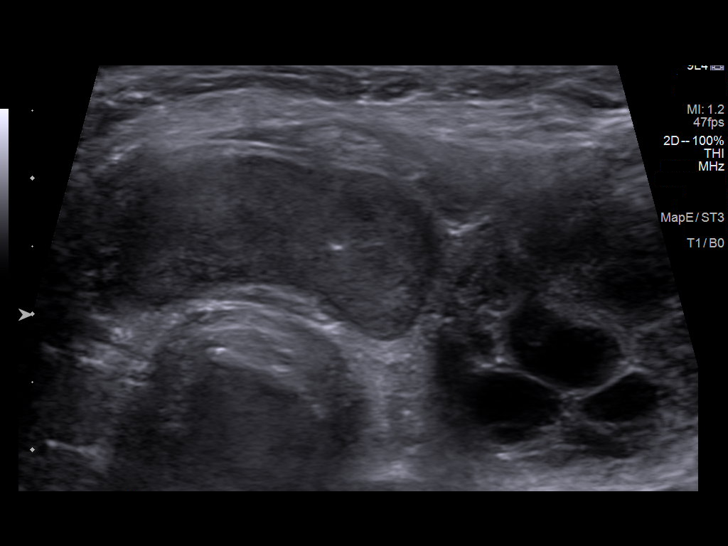
[im 6/11]
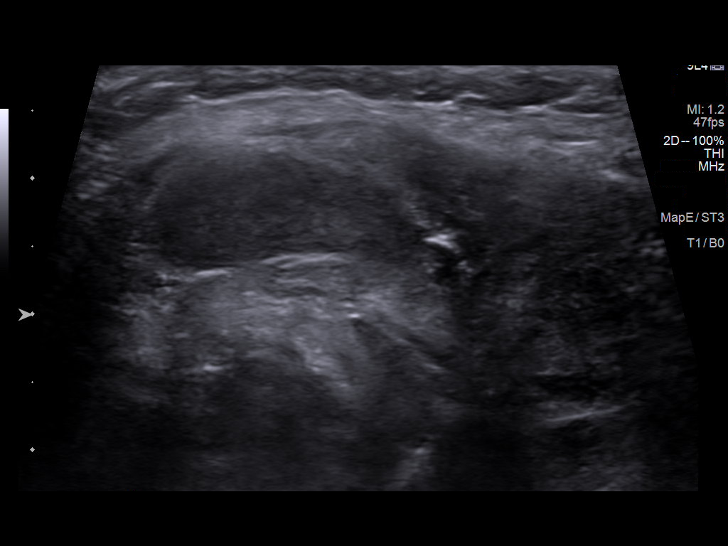
[im 7/11]
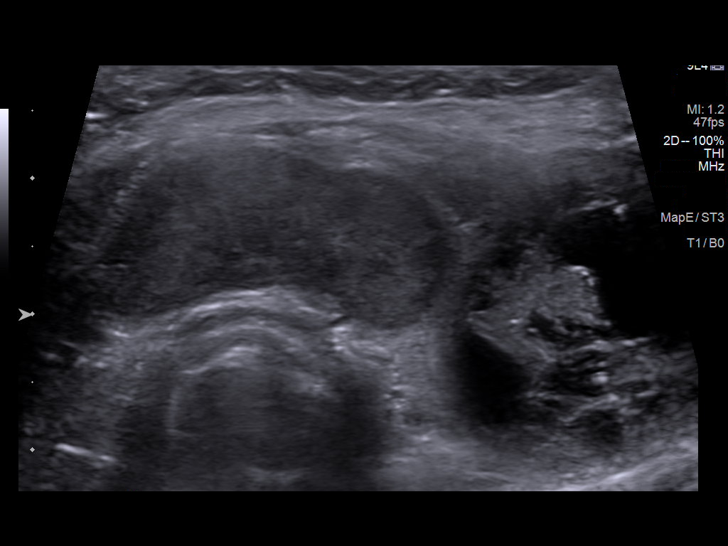
[im 8/11]
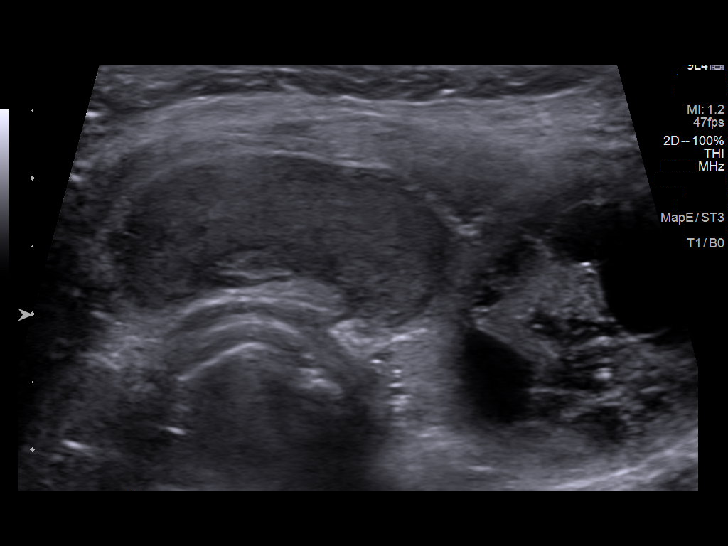
[im 9/11]
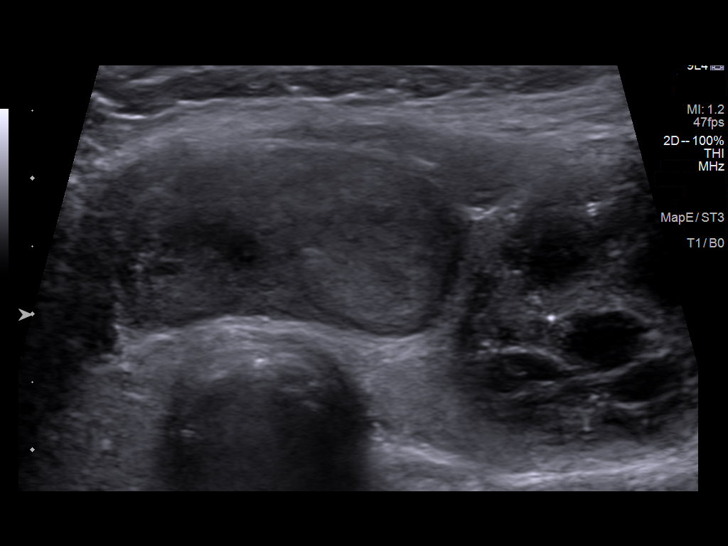
[im 10/11]
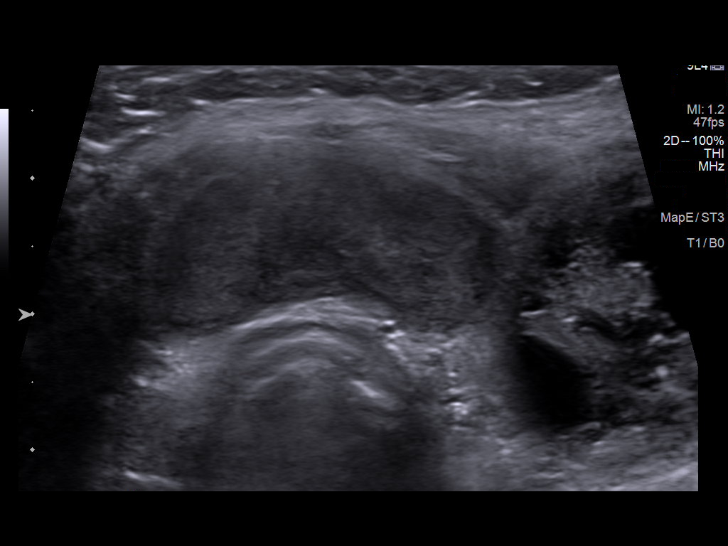
[im 11/11]
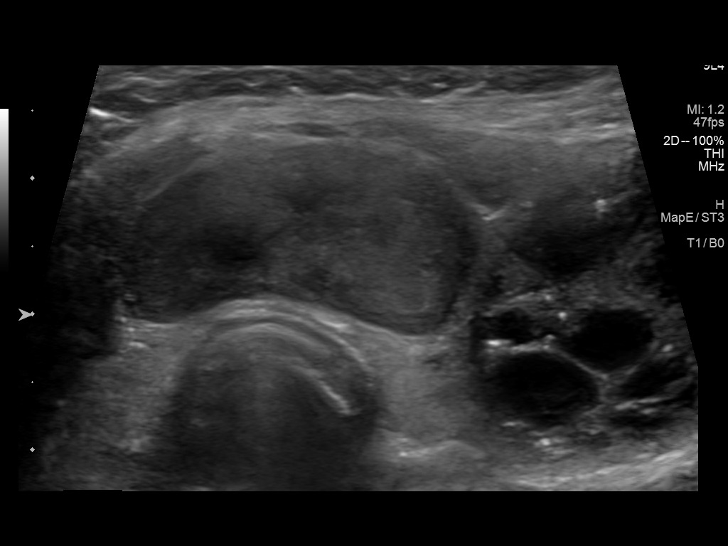

[11 of 11 positions shown; findings below may reference images not displayed]

Pre-procedural ultrasound scanning demonstrated unchanged size and
appearance of the indeterminate nodule within the inferior aspect of
the isthmus

The procedure was planned. The neck was prepped in the usual sterile
fashion, and a sterile drape was applied covering the operative
field. A timeout was performed prior to the initiation of the
procedure. Local anesthesia was provided with 1% lidocaine.

Under direct ultrasound guidance, 5 FNA biopsies were performed of
the nodule located in the inferior aspect of the isthmus with a 25
gauge needle.

Two samples were sent to [facility] per ordering [facility].

Multiple ultrasound images were saved for procedural documentation
purposes. The samples were prepared and submitted to pathology.

Limited post procedural scanning was negative for hematoma or
additional complication. Dressings were placed. The patient
tolerated the above procedures procedure well without immediate
postprocedural complication.
FINDINGS: Nodule reference number based on prior diagnostic ultrasound: 1

Maximum size:

Location: Isthmus; Inferior

ACR TI-RADS risk category: TR4 (4-6 points)

Reason for biopsy: meets ACR TI-RADS criteria

Ultrasound imaging confirms appropriate placement of the needles
within the thyroid nodule.
IMPRESSION: Technically successful ultrasound guided fine needle aspiration of
nodule located in the inferior aspect of the isthmus.

Read by: Ogonz Bilogo, NP

## 2022-12-15 ENCOUNTER — Other Ambulatory Visit (HOSPITAL_COMMUNITY): Payer: Self-pay

## 2023-01-02 ENCOUNTER — Other Ambulatory Visit (HOSPITAL_COMMUNITY): Payer: Self-pay

## 2023-01-26 ENCOUNTER — Encounter: Payer: Self-pay | Admitting: Radiology

## 2023-01-31 ENCOUNTER — Other Ambulatory Visit: Payer: Self-pay

## 2023-02-07 ENCOUNTER — Encounter: Payer: Self-pay | Admitting: Emergency Medicine

## 2023-02-07 ENCOUNTER — Ambulatory Visit
Admission: EM | Admit: 2023-02-07 | Discharge: 2023-02-07 | Disposition: A | Discharge status: Home / Self Care | Payer: Commercial Managed Care - PPO | Attending: Internal Medicine | Admitting: Internal Medicine

## 2023-02-07 ENCOUNTER — Other Ambulatory Visit: Payer: Self-pay

## 2023-02-07 DIAGNOSIS — N3001 Acute cystitis with hematuria: Secondary | ICD-10-CM | POA: Diagnosis not present

## 2023-02-07 LAB — POCT URINALYSIS DIP (MANUAL ENTRY)
Glucose, UA: 250 mg/dL — AB
Nitrite, UA: POSITIVE — AB
Protein Ur, POC: >=300 mg/dL — AB
Spec Grav, UA: 1.02 (ref 1.010–1.025)
Urobilinogen, UA: >=8 E.U./dL — AB
pH, UA: 5 (ref 5.0–8.0)

## 2023-02-07 MED ORDER — PHENAZOPYRIDINE HCL 200 MG PO TABS: 200.0000 mg | ORAL_TABLET | Freq: Three times a day (TID) | ORAL | 0 refills | Status: DC | PRN

## 2023-02-07 MED ORDER — NITROFURANTOIN MONOHYD MACRO 100 MG PO CAPS: 100.0000 mg | ORAL_CAPSULE | Freq: Two times a day (BID) | ORAL | 0 refills | Status: AC

## 2023-02-07 NOTE — Discharge Instructions (Signed)
Take the Macrobid to treat the UTI; you can also use the pyridium to help with bladder pain.

## 2023-02-07 NOTE — ED Triage Notes (Addendum)
Pt reports dysuria, urinary frequency since Sunday. Denies any known fevers. Has tried AZO and cranberry juice.

## 2023-02-07 NOTE — ED Provider Notes (Signed)
[facility]    CSN: [id number] Arrival date & time: 02/07/23  0956      History   Chief Complaint Chief Complaint  Patient presents with   Dysuria    HPI Anne Kramer is a 52 y.o. female.   Patient presents today for 2-day history of burning with urination, increased urinary frequency and urgency, and voiding small amounts.  Reports hematuria began today.  No abdominal pain, flank pain, new back pain, fever, or nausea/vomiting.  Has tried cranberry juice and Azo, increasing water intake without improvement.  No vaginal discharge or concern for STI today.  Last UTI was approximately 3 months ago, was treated with Bactrim.  Urine culture did not show any resistant antibiotics.    Past Medical History:  Diagnosis Date   Abnormal mammogram    Abnormal Pap smear of cervix    Anemia    hx after hysterectomy -no issues now   Anxiety    Cancer ([place]) 2023   thyroid   Depression    Diabetes mellitus without complication (HCC)    GERD (gastroesophageal reflux disease)    History of hiatal hernia    Hyperlipidemia    PONV (postoperative nausea and vomiting)    Restless leg syndrome     Patient Active Problem List   Diagnosis Date Noted   Papillary thyroid carcinoma ([place]) 09/07/2022   Thyromegaly 04/18/2022   Foreign body of right ear 04/18/2022   Non-restorative sleep 12/06/2021   Class 2 obesity due to excess calories without serious comorbidity with body mass index (BMI) of 36.0 to 36.9 in adult 12/06/2021   Snoring 12/06/2021   Nocturia more than twice per night 12/06/2021   Elbow pain, right 11/09/2021   Thumb pain, right 11/09/2021   Pre-diabetes 05/25/2021   RLS (restless legs syndrome) 05/06/2021   Encounter to establish care 05/06/2021   Anxiety and depression 03/12/2020   BMI 38.0-38.9,adult 03/12/2020   Gastroesophageal reflux disease without esophagitis 03/12/2020   Other hyperlipidemia 03/12/2020    Past Surgical History:  Procedure  Laterality Date   ABDOMINAL HYSTERECTOMY  2019   CHOLECYSTECTOMY  2000   COLONOSCOPY  2017   THYROIDECTOMY Bilateral 09/07/2022   Procedure: TOTAL THYROIDECTOMY;  Surgeon: Jason Coop, DO;  Location: [place];  Service: ENT;  Laterality: Bilateral;   UPPER GASTROINTESTINAL ENDOSCOPY  2017    OB History   No obstetric history on file.      Home Medications    Prior to Admission medications   Medication Sig Start Date End Date Taking? Authorizing Provider  nitrofurantoin, macrocrystal-monohydrate, (MACROBID) 100 MG capsule Take 1 capsule (100 mg total) by mouth 2 (two) times daily for 5 days. 02/07/23 02/12/23 Yes Eulogio Bear, NP  atorvastatin (LIPITOR) 20 MG tablet Take 1 tablet (20 mg total) by mouth daily. 05/12/22   Orma Render, NP  blood glucose meter kit and supplies Dispense based on patient and insurance preference. Use up to four times daily as directed. (FOR ICD-10 R73.03, Z68.38) 05/25/21   Early, Coralee Pesa, NP  buPROPion (WELLBUTRIN) 100 MG tablet Take 1 tablet (100 mg total) by mouth daily. 05/12/22   Orma Render, NP  docusate sodium (COLACE) 100 MG capsule Take 1 capsule (100 mg total) by mouth 2 (two) times daily. 09/08/22   Skotnicki, Meghan A, DO  esomeprazole (NEXIUM) 40 MG capsule Take 1 capsule by mouth daily. 08/29/22   Orma Render, NP  gabapentin (NEURONTIN) 100 MG capsule Take 1 -  3 capsules by mouth at bedtime. Patient taking differently: Take 300 mg by mouth at bedtime. 05/12/22   Orma Render, NP  levothyroxine (SYNTHROID) 150 MCG tablet Take 1 tablet (150 mcg total) by mouth daily at 6 (six) AM. Patient taking differently: Take 125 mcg by mouth daily at 6 (six) AM. 09/09/22   Skotnicki, Meghan A, DO  ondansetron (ZOFRAN) 4 MG tablet Take 1 tablet (4 mg total) by mouth every 8 (eight) hours as needed for nausea or vomiting. Patient not taking: Reported on 08/25/2022 11/09/21   Orma Render, NP  phenazopyridine (PYRIDIUM) 200 MG tablet Take 1 tablet  (200 mg total) by mouth 3 (three) times daily as needed for pain. 02/07/23   Eulogio Bear, NP  pramipexole (MIRAPEX) 0.125 MG tablet Take 1 tablet (0.125 mg total) by mouth at bedtime. Patient not taking: Reported on 08/25/2022 12/06/21   Dohmeier, Asencion Partridge, MD  Semaglutide,0.25 or 0.'5MG'$ /DOS, (OZEMPIC, 0.25 OR 0.5 MG/DOSE,) 2 MG/3ML SOPN Inject 0.'25mg'$  into the skin once a week for 4 weeks, then increase to 0.'5mg'$  once a week as directed. 12/09/22   de [place], Raymond J, MD  venlafaxine XR (EFFEXOR-XR) 150 MG 24 hr capsule Take 1 capsule by mouth daily with breakfast. 05/12/22   Early, Coralee Pesa, NP    Family History Family History  Problem Relation Age of Onset   High blood pressure Mother    High Cholesterol Mother    Depression Mother    Anxiety disorder Mother    Atrial fibrillation Mother    Thyroid disease Mother    High blood pressure Father    Heart disease Father    High Cholesterol Sister    Cancer Sister    Breast cancer Maternal Aunt    Breast cancer Paternal Grandmother     Social History Social History   Tobacco Use   Smoking status: Never   Smokeless tobacco: Never  Vaping Use   Vaping Use: Never used  Substance Use Topics   Alcohol use: Yes    Comment: once a month   Drug use: Never     Allergies   Kiwi extract   Review of Systems Review of Systems Per HPI  Physical Exam Triage Vital Signs ED Triage Vitals  Enc Vitals Group     BP 02/07/23 1015 136/86     Pulse Rate 02/07/23 1015 88     Resp 02/07/23 1015 17     Temp 02/07/23 1015 97.7 F (36.5 C)     Temp Source 02/07/23 1015 Oral     SpO2 02/07/23 1015 96 %     Weight --      Height --      Head Circumference --      Peak Flow --      Pain Score 02/07/23 1022 0     Pain Loc --      Pain Edu? --      Excl. in [place]? --    No data found.  Updated Vital Signs BP 136/86 (BP Location: Right Arm)   Pulse 88   Temp 97.7 F (36.5 C) (Oral)   Resp 17   SpO2 96%   Visual Acuity Right Eye  Distance:   Left Eye Distance:   Bilateral Distance:    Right Eye Near:   Left Eye Near:    Bilateral Near:     Physical Exam Vitals and nursing note reviewed.  Constitutional:      General: She is not in  acute distress.    Appearance: She is not toxic-appearing.  Pulmonary:     Effort: Pulmonary effort is normal. No respiratory distress.  Abdominal:     General: Abdomen is flat. Bowel sounds are normal. There is no distension.     Palpations: Abdomen is soft. There is no mass.     Tenderness: There is no abdominal tenderness. There is no right CVA tenderness, left CVA tenderness or guarding.  Skin:    General: Skin is warm and dry.     Coloration: Skin is not jaundiced or pale.     Findings: No erythema.  Neurological:     Mental Status: She is alert and oriented to person, place, and time.  Psychiatric:        Behavior: Behavior is cooperative.      UC Treatments / Results  Labs (all labs ordered are listed, but only abnormal results are displayed) Labs Reviewed  POCT URINALYSIS DIP (MANUAL ENTRY) - Abnormal; Notable for the following components:      Result Value   Color, UA red (*)    Clarity, UA cloudy (*)    Glucose, UA =250 (*)    Bilirubin, UA large (*)    Ketones, POC UA small (15) (*)    Blood, UA large (*)    Protein Ur, POC >=300 (*)    Urobilinogen, UA >=8.0 (*)    Nitrite, UA Positive (*)    Leukocytes, UA Large (3+) (*)    All other components within normal limits  URINE CULTURE    EKG   Radiology No results found.  Procedures Procedures (including critical care time)  Medications Ordered in UC Medications - No data to display  Initial Impression / Assessment and Plan / UC Course  I have reviewed the triage vital signs and the nursing notes.  Pertinent labs & imaging results that were available during my care of the patient were reviewed by me and considered in my medical decision making (see chart for details).   Patient is  well-appearing, normotensive, afebrile, not tachycardic, not tachypneic, oxygenating well on room air.    1. Acute cystitis with hematuria Vital signs and examination today are reassuring Urinalysis unreliable secondary to Pyridium use Urine culture is pending In meantime, treat with Macrobid twice daily for 5 days, start Pyridium 200 mg every 8 hours as needed for bladder pain ER and return precautions discussed with patient   The patient was given the opportunity to ask questions.  All questions answered to their satisfaction.  The patient is in agreement to this plan.    Final Clinical Impressions(s) / UC Diagnoses   Final diagnoses:  Acute cystitis with hematuria     Discharge Instructions      Take the Macrobid to treat the UTI; you can also use the pyridium to help with bladder pain.   ED Prescriptions     Medication Sig Dispense Auth. Provider   nitrofurantoin, macrocrystal-monohydrate, (MACROBID) 100 MG capsule Take 1 capsule (100 mg total) by mouth 2 (two) times daily for 5 days. 10 capsule Noemi Chapel A, NP   phenazopyridine (PYRIDIUM) 200 MG tablet Take 1 tablet (200 mg total) by mouth 3 (three) times daily as needed for pain. 9 tablet Eulogio Bear, NP      PDMP not reviewed this encounter.   Eulogio Bear, NP 02/07/23 1037

## 2023-02-09 LAB — URINE CULTURE: Culture: >=100000 — AB

## 2023-02-15 ENCOUNTER — Encounter: Payer: Self-pay | Admitting: Emergency Medicine

## 2023-02-15 ENCOUNTER — Ambulatory Visit
Admission: EM | Admit: 2023-02-15 | Discharge: 2023-02-15 | Disposition: A | Discharge status: Home / Self Care | Payer: Commercial Managed Care - PPO | Attending: Nurse Practitioner | Admitting: Nurse Practitioner

## 2023-02-15 ENCOUNTER — Other Ambulatory Visit: Payer: Self-pay

## 2023-02-15 DIAGNOSIS — N3001 Acute cystitis with hematuria: Secondary | ICD-10-CM | POA: Diagnosis not present

## 2023-02-15 LAB — POCT URINALYSIS DIP (MANUAL ENTRY)
Bilirubin, UA: NEGATIVE
Glucose, UA: NEGATIVE mg/dL
Ketones, POC UA: NEGATIVE mg/dL
Nitrite, UA: NEGATIVE
Protein Ur, POC: >=300 mg/dL — AB
Spec Grav, UA: >=1.03 — AB (ref 1.010–1.025)
Urobilinogen, UA: 0.2 E.U./dL
pH, UA: 6.5 (ref 5.0–8.0)

## 2023-02-15 MED ORDER — CEFUROXIME AXETIL 500 MG PO TABS: 500.0000 mg | ORAL_TABLET | Freq: Two times a day (BID) | ORAL | 0 refills | Status: AC

## 2023-02-15 MED ORDER — PHENAZOPYRIDINE HCL 200 MG PO TABS: 200.0000 mg | ORAL_TABLET | Freq: Three times a day (TID) | ORAL | 0 refills | Status: DC

## 2023-02-15 MED ORDER — FLUCONAZOLE 150 MG PO TABS: 150.0000 mg | ORAL_TABLET | Freq: Once | ORAL | 0 refills | Status: AC

## 2023-02-15 NOTE — ED Triage Notes (Signed)
Pt reports finished abx for UTI on Sunday and reports yesterday dysuria, vaginal itching and then urinary frequency started this afternoon.

## 2023-02-15 NOTE — Discharge Instructions (Addendum)
-  Take medications as prescribed. -Increase fluids. -Ibuprofen or Tylenol for pain, fever, or general discomfort. -Develop a toileting schedule that will allow you to toilet at least every 2 hours. -Avoid caffeine to include tea, soda, and coffee. -If sexually active, void at least 15 to 20 minutes after sexual intercourse. -Follow-up in the emergency department if you develop fever, chills, worsening abdominal pain, or other concerns.  

## 2023-02-15 NOTE — ED Provider Notes (Signed)
[facility]    CSN: [id number] Arrival date & time: 02/15/23  1630      History   Chief Complaint Chief Complaint  Patient presents with   Dysuria    HPI Anne Kramer is a 52 y.o. female.   The history is provided by the patient.   The patient presents for complaints of urinary symptoms that started today.  Patient was recently treated for UTI on 02/07/2023.  She states that she was treated with Macrobid.  She states symptoms improved, but today, she developed dysuria, hematuria, urinary frequency, urgency, and pressure with urination.  She denies fever, chills, abdominal pain, vaginal discharge, vaginal odor, vaginal itching, decreased urine stream.  Patient reports 2 urinary tract infections over the past 3 months.  She states she did have sexual intercourse prior to her symptoms starting.  Patient no history of kidney stones.  Past Medical History:  Diagnosis Date   Abnormal mammogram    Abnormal Pap smear of cervix    Anemia    hx after hysterectomy -no issues now   Anxiety    Cancer ([place]) 2023   thyroid   Depression    Diabetes mellitus without complication (HCC)    GERD (gastroesophageal reflux disease)    History of hiatal hernia    Hyperlipidemia    PONV (postoperative nausea and vomiting)    Restless leg syndrome     Patient Active Problem List   Diagnosis Date Noted   Papillary thyroid carcinoma ([place]) 09/07/2022   Thyromegaly 04/18/2022   Foreign body of right ear 04/18/2022   Non-restorative sleep 12/06/2021   Class 2 obesity due to excess calories without serious comorbidity with body mass index (BMI) of 36.0 to 36.9 in adult 12/06/2021   Snoring 12/06/2021   Nocturia more than twice per night 12/06/2021   Elbow pain, right 11/09/2021   Thumb pain, right 11/09/2021   Pre-diabetes 05/25/2021   RLS (restless legs syndrome) 05/06/2021   Encounter to establish care 05/06/2021   Anxiety and depression 03/12/2020   BMI 38.0-38.9,adult  03/12/2020   Gastroesophageal reflux disease without esophagitis 03/12/2020   Other hyperlipidemia 03/12/2020    Past Surgical History:  Procedure Laterality Date   ABDOMINAL HYSTERECTOMY  2019   CHOLECYSTECTOMY  2000   COLONOSCOPY  2017   THYROIDECTOMY Bilateral 09/07/2022   Procedure: TOTAL THYROIDECTOMY;  Surgeon: Jason Coop, DO;  Location: [place];  Service: ENT;  Laterality: Bilateral;   UPPER GASTROINTESTINAL ENDOSCOPY  2017    OB History   No obstetric history on file.      Home Medications    Prior to Admission medications   Medication Sig Start Date End Date Taking? Authorizing Provider  cefUROXime (CEFTIN) 500 MG tablet Take 1 tablet (500 mg total) by mouth 2 (two) times daily with a meal for 7 days. 02/15/23 02/22/23 Yes Justyce Yeater-Warren, Alda Lea, NP  fluconazole (DIFLUCAN) 150 MG tablet Take 1 tablet (150 mg total) by mouth once for 1 dose. 02/15/23 02/15/23 Yes Josephmichael Lisenbee-Warren, Alda Lea, NP  phenazopyridine (PYRIDIUM) 200 MG tablet Take 1 tablet (200 mg total) by mouth 3 (three) times daily. 02/15/23  Yes Reza Crymes-Warren, Alda Lea, NP  atorvastatin (LIPITOR) 20 MG tablet Take 1 tablet (20 mg total) by mouth daily. 05/12/22   Orma Render, NP  blood glucose meter kit and supplies Dispense based on patient and insurance preference. Use up to four times daily as directed. (FOR ICD-10 R73.03, Z68.38) 05/25/21   Early, Coralee Pesa, NP  buPROPion (WELLBUTRIN) 100 MG tablet Take 1 tablet (100 mg total) by mouth daily. 05/12/22   Orma Render, NP  docusate sodium (COLACE) 100 MG capsule Take 1 capsule (100 mg total) by mouth 2 (two) times daily. 09/08/22   Skotnicki, Meghan A, DO  esomeprazole (NEXIUM) 40 MG capsule Take 1 capsule by mouth daily. 08/29/22   Orma Render, NP  gabapentin (NEURONTIN) 100 MG capsule Take 1 - 3 capsules by mouth at bedtime. Patient taking differently: Take 300 mg by mouth at bedtime. 05/12/22   Orma Render, NP  levothyroxine (SYNTHROID) 150 MCG tablet  Take 1 tablet (150 mcg total) by mouth daily at 6 (six) AM. Patient taking differently: Take 125 mcg by mouth daily at 6 (six) AM. 09/09/22   Skotnicki, Meghan A, DO  ondansetron (ZOFRAN) 4 MG tablet Take 1 tablet (4 mg total) by mouth every 8 (eight) hours as needed for nausea or vomiting. Patient not taking: Reported on 08/25/2022 11/09/21   Orma Render, NP  pramipexole (MIRAPEX) 0.125 MG tablet Take 1 tablet (0.125 mg total) by mouth at bedtime. Patient not taking: Reported on 08/25/2022 12/06/21   Dohmeier, Asencion Partridge, MD  Semaglutide,0.25 or 0.5MG /DOS, (OZEMPIC, 0.25 OR 0.5 MG/DOSE,) 2 MG/3ML SOPN Inject 0.25mg  into the skin once a week for 4 weeks, then increase to 0.5mg  once a week as directed. 12/09/22   de [place], Raymond J, MD  venlafaxine XR (EFFEXOR-XR) 150 MG 24 hr capsule Take 1 capsule by mouth daily with breakfast. 05/12/22   Early, Coralee Pesa, NP    Family History Family History  Problem Relation Age of Onset   High blood pressure Mother    High Cholesterol Mother    Depression Mother    Anxiety disorder Mother    Atrial fibrillation Mother    Thyroid disease Mother    High blood pressure Father    Heart disease Father    High Cholesterol Sister    Cancer Sister    Breast cancer Maternal Aunt    Breast cancer Paternal Grandmother     Social History Social History   Tobacco Use   Smoking status: Never   Smokeless tobacco: Never  Vaping Use   Vaping Use: Never used  Substance Use Topics   Alcohol use: Yes    Comment: once a month   Drug use: Never     Allergies   Kiwi extract   Review of Systems Review of Systems Per HPI  Physical Exam Triage Vital Signs ED Triage Vitals [02/15/23 1651]  Enc Vitals Group     BP 117/80     Pulse Rate 78     Resp 20     Temp 98 F (36.7 C)     Temp Source Oral     SpO2 96 %     Weight      Height      Head Circumference      Peak Flow      Pain Score      Pain Loc      Pain Edu?      Excl. in [place]?    No data  found.  Updated Vital Signs BP 117/80 (BP Location: Right Arm)   Pulse 78   Temp 98 F (36.7 C) (Oral)   Resp 20   SpO2 96%   Visual Acuity Right Eye Distance:   Left Eye Distance:   Bilateral Distance:    Right Eye Near:   Left Eye Near:  Bilateral Near:     Physical Exam Vitals and nursing note reviewed.  Constitutional:      General: She is not in acute distress.    Appearance: She is well-developed.  HENT:     Head: Normocephalic.  Eyes:     Extraocular Movements: Extraocular movements intact.     Pupils: Pupils are equal, round, and reactive to light.  Cardiovascular:     Rate and Rhythm: Normal rate and regular rhythm.     Pulses: Normal pulses.     Heart sounds: Normal heart sounds.  Pulmonary:     Effort: Pulmonary effort is normal.     Breath sounds: Normal breath sounds.  Abdominal:     General: Bowel sounds are normal. There is no distension.     Palpations: Abdomen is soft. There is no mass.     Tenderness: There is no abdominal tenderness. There is no right CVA tenderness, left CVA tenderness, guarding or rebound.     Hernia: No hernia is present.  Genitourinary:    Vagina: Normal. No vaginal discharge.     Comments: GU exam deferred, self swab performed  Musculoskeletal:     Cervical back: Normal range of motion.  Skin:    General: Skin is warm and dry.     Findings: No erythema or rash.  Neurological:     General: No focal deficit present.     Mental Status: She is alert and oriented to person, place, and time.     Cranial Nerves: No cranial nerve deficit.  Psychiatric:        Mood and Affect: Mood normal.        Behavior: Behavior normal.      UC Treatments / Results  Labs (all labs ordered are listed, but only abnormal results are displayed) Labs Reviewed  POCT URINALYSIS DIP (MANUAL ENTRY) - Abnormal; Notable for the following components:      Result Value   Color, UA orange (*)    Clarity, UA cloudy (*)    Spec Grav, UA >=1.030  (*)    Blood, UA large (*)    Protein Ur, POC >=300 (*)    Leukocytes, UA Large (3+) (*)    All other components within normal limits  URINE CULTURE  CERVICOVAGINAL ANCILLARY ONLY    EKG   Radiology No results found.  Procedures Procedures (including critical care time)  Medications Ordered in UC Medications - No data to display  Initial Impression / Assessment and Plan / UC Course  I have reviewed the triage vital signs and the nursing notes.  Pertinent labs & imaging results that were available during my care of the patient were reviewed by me and considered in my medical decision making (see chart for details).  Patient is well-appearing, she is in no acute distress, vital signs are stable.  Urinalysis with positive leukocytes, protein, blood, and elevated specific gravity, suggesting a urinary tract infection.  Urine culture is pending.  In the interim, will treat patient empirically with Ceftin 500 mg twice daily for the next 7 days.  Patient was also prescribed Pyridium 200 mg as needed for dysuria.  Supportive care recommendations were provided and discussed with the patient to include increasing her fluid intake, developing a toileting schedule, and voiding after sexual intercourse.  Patient was given strict follow-up precautions.  Patient is in agreement with this plan of care and verbalized understanding.  All questions were answered.  Patient stable for discharge.   Final Clinical Impressions(s) /  UC Diagnoses   Final diagnoses:  Acute cystitis with hematuria     Discharge Instructions      -Take medications as prescribed. -Increase fluids. -Ibuprofen or Tylenol for pain, fever, or general discomfort. -Develop a toileting schedule that will allow you to toilet at least every 2 hours. -Avoid caffeine to include tea, soda, and coffee. -If sexually active, void at least 15 to 20 minutes after sexual intercourse. -Follow-up in the emergency department if you  develop fever, chills, worsening abdominal pain, or other concerns.     ED Prescriptions     Medication Sig Dispense Auth. Provider   cefUROXime (CEFTIN) 500 MG tablet Take 1 tablet (500 mg total) by mouth 2 (two) times daily with a meal for 7 days. 14 tablet Orie Cuttino-Warren, Alda Lea, NP   fluconazole (DIFLUCAN) 150 MG tablet Take 1 tablet (150 mg total) by mouth once for 1 dose. 1 tablet Jeanice Dempsey-Warren, Alda Lea, NP   phenazopyridine (PYRIDIUM) 200 MG tablet Take 1 tablet (200 mg total) by mouth 3 (three) times daily. 6 tablet Sonakshi Rolland-Warren, Alda Lea, NP      PDMP not reviewed this encounter.   Tish Men, NP 02/15/23 1730

## 2023-02-16 LAB — CERVICOVAGINAL ANCILLARY ONLY
Bacterial Vaginitis (gardnerella): NEGATIVE
Candida Glabrata: POSITIVE — AB
Candida Vaginitis: NEGATIVE
Chlamydia: NEGATIVE
Comment: NEGATIVE
Comment: NEGATIVE
Comment: NEGATIVE
Comment: NEGATIVE
Comment: NEGATIVE
Comment: NORMAL
Neisseria Gonorrhea: NEGATIVE
Trichomonas: NEGATIVE

## 2023-02-16 LAB — URINE CULTURE: Culture: NO GROWTH

## 2023-02-21 ENCOUNTER — Other Ambulatory Visit: Payer: Self-pay

## 2023-02-22 ENCOUNTER — Other Ambulatory Visit (HOSPITAL_COMMUNITY): Payer: Self-pay

## 2023-02-28 ENCOUNTER — Other Ambulatory Visit: Payer: Self-pay

## 2023-03-06 ENCOUNTER — Other Ambulatory Visit (HOSPITAL_COMMUNITY): Payer: Self-pay

## 2023-03-14 ENCOUNTER — Other Ambulatory Visit: Payer: Self-pay

## 2023-03-21 ENCOUNTER — Other Ambulatory Visit (HOSPITAL_BASED_OUTPATIENT_CLINIC_OR_DEPARTMENT_OTHER): Payer: Self-pay

## 2023-03-21 DIAGNOSIS — R310 Gross hematuria: Secondary | ICD-10-CM | POA: Diagnosis not present

## 2023-03-21 DIAGNOSIS — N3020 Other chronic cystitis without hematuria: Secondary | ICD-10-CM | POA: Diagnosis not present

## 2023-03-21 MED ORDER — NITROFURANTOIN MONOHYD MACRO 100 MG PO CAPS
100.0000 mg | ORAL_CAPSULE | Freq: Every day | ORAL | 3 refills | Status: DC
  Filled 2023-03-21: qty 30, 30d supply, fill #0
  Filled 2023-04-18: qty 30, 30d supply, fill #1
  Filled 2023-05-18: qty 30, 30d supply, fill #2
  Filled 2023-06-18: qty 30, 30d supply, fill #3

## 2023-03-23 ENCOUNTER — Other Ambulatory Visit (HOSPITAL_COMMUNITY): Payer: Self-pay

## 2023-03-24 ENCOUNTER — Ambulatory Visit (HOSPITAL_BASED_OUTPATIENT_CLINIC_OR_DEPARTMENT_OTHER): Payer: Commercial Managed Care - PPO | Admitting: Family Medicine

## 2023-03-24 ENCOUNTER — Encounter (HOSPITAL_BASED_OUTPATIENT_CLINIC_OR_DEPARTMENT_OTHER): Payer: Self-pay | Admitting: Family Medicine

## 2023-03-24 VITALS — BP 125/88 | HR 77 | Temp 97.9°F | Ht 65.0 in | Wt 183.4 lb

## 2023-03-24 DIAGNOSIS — R070 Pain in throat: Secondary | ICD-10-CM

## 2023-03-24 NOTE — Progress Notes (Unsigned)
   Established Patient Office Visit  Subjective   Patient ID: Anne Kramer, female    DOB: Apr 21, 1971  Age: 52 y.o. MRN: [id number]  [name]   Thyroid removed in October   Started February or March. Noticed a couple months ago, that her neck is very uneven-- noticed that R side is painful, every time she moves her head or neck, smiles, or even chews- it hurts. Not sharp pain, nagging/annoying pain. Feels muscular- like a tight/pulling. Her SCM muscle sticks out farther.   Midline neck incision is well healed. No hematoma.   ROS    Objective:     BP 125/88 (BP Location: Left Arm, Patient Position: Sitting, Cuff Size: Normal)   Pulse 77   Temp 97.9 F (36.6 C) (Oral)   Ht 5\' 5"  (1.651 m)   Wt 183 lb 6.4 oz (83.2 kg)   SpO2 100%   BMI 30.52 kg/m  BP Readings from Last 3 Encounters:  03/24/23 125/88  02/15/23 117/80  02/07/23 136/86     Physical Exam    Assessment & Plan:  There are no diagnoses linked to this encounter.   No follow-ups on file.    Alyson Reedy, FNP

## 2023-03-26 DIAGNOSIS — R070 Pain in throat: Secondary | ICD-10-CM | POA: Insufficient documentation

## 2023-03-28 ENCOUNTER — Ambulatory Visit (HOSPITAL_BASED_OUTPATIENT_CLINIC_OR_DEPARTMENT_OTHER): Payer: Commercial Managed Care - PPO | Admitting: Family Medicine

## 2023-04-13 DIAGNOSIS — C73 Malignant neoplasm of thyroid gland: Secondary | ICD-10-CM | POA: Diagnosis not present

## 2023-04-13 DIAGNOSIS — E890 Postprocedural hypothyroidism: Secondary | ICD-10-CM | POA: Diagnosis not present

## 2023-04-13 DIAGNOSIS — M542 Cervicalgia: Secondary | ICD-10-CM | POA: Diagnosis not present

## 2023-04-14 ENCOUNTER — Other Ambulatory Visit (HOSPITAL_COMMUNITY): Payer: Self-pay | Admitting: Family Medicine

## 2023-04-14 DIAGNOSIS — Z1231 Encounter for screening mammogram for malignant neoplasm of breast: Secondary | ICD-10-CM

## 2023-04-19 ENCOUNTER — Other Ambulatory Visit (HOSPITAL_COMMUNITY): Payer: Self-pay

## 2023-04-19 ENCOUNTER — Other Ambulatory Visit: Payer: Self-pay

## 2023-04-27 DIAGNOSIS — C73 Malignant neoplasm of thyroid gland: Secondary | ICD-10-CM | POA: Diagnosis not present

## 2023-04-27 DIAGNOSIS — E890 Postprocedural hypothyroidism: Secondary | ICD-10-CM | POA: Diagnosis not present

## 2023-05-01 ENCOUNTER — Ambulatory Visit (HOSPITAL_COMMUNITY)
Admission: RE | Admit: 2023-05-01 | Discharge: 2023-05-01 | Disposition: A | Discharge status: Home / Self Care | Payer: Commercial Managed Care - PPO | Source: Ambulatory Visit | Attending: Family Medicine | Admitting: Family Medicine

## 2023-05-01 DIAGNOSIS — Z1231 Encounter for screening mammogram for malignant neoplasm of breast: Secondary | ICD-10-CM | POA: Diagnosis not present

## 2023-05-05 DIAGNOSIS — R319 Hematuria, unspecified: Secondary | ICD-10-CM | POA: Diagnosis not present

## 2023-05-05 DIAGNOSIS — N3020 Other chronic cystitis without hematuria: Secondary | ICD-10-CM | POA: Diagnosis not present

## 2023-05-15 ENCOUNTER — Encounter (HOSPITAL_BASED_OUTPATIENT_CLINIC_OR_DEPARTMENT_OTHER): Payer: 59 | Admitting: Nurse Practitioner

## 2023-05-16 ENCOUNTER — Ambulatory Visit (INDEPENDENT_AMBULATORY_CARE_PROVIDER_SITE_OTHER): Payer: Commercial Managed Care - PPO | Admitting: Family Medicine

## 2023-05-16 ENCOUNTER — Encounter (HOSPITAL_BASED_OUTPATIENT_CLINIC_OR_DEPARTMENT_OTHER): Payer: Self-pay | Admitting: Family Medicine

## 2023-05-16 VITALS — BP 126/87 | HR 72 | Ht 65.0 in | Wt 182.4 lb

## 2023-05-16 DIAGNOSIS — Z1211 Encounter for screening for malignant neoplasm of colon: Secondary | ICD-10-CM

## 2023-05-16 DIAGNOSIS — Z0000 Encounter for general adult medical examination without abnormal findings: Secondary | ICD-10-CM | POA: Diagnosis not present

## 2023-05-16 NOTE — Assessment & Plan Note (Signed)
Routine HCM labs ordered. HCM reviewed/discussed. Anticipatory guidance regarding healthy weight, lifestyle and choices given. Recommend healthy diet.  Recommend approximately 150 minutes/week of moderate intensity exercise Recommend regular dental and vision exams Always use seatbelt/lap and shoulder restraints Recommend using smoke alarms and checking batteries at least twice a year Recommend using sunscreen when outside Discussed colon cancer screening recommendations, options.  Patient will consider and let us know how he would like to proceed Discussed recommendations for shingles vaccine. Discussed tetanus immunization recommendations, patient is UTD

## 2023-05-16 NOTE — Patient Instructions (Signed)
  Medication Instructions:  Your physician recommends that you continue on your current medications as directed. Please refer to the Current Medication list given to you today. --If you need a refill on any your medications before your next appointment, please call your pharmacy first. If no refills are authorized on file call the office.-- Lab Work: Your physician has recommended that you have lab work today: Yes If you have labs (blood work) drawn today and your tests are completely normal, you will receive your results via MyChart message OR a phone call from our staff.  Please ensure you check your voicemail in the event that you authorized detailed messages to be left on a delegated number. If you have any lab test that is abnormal or we need to change your treatment, we will call you to review the results.  Referrals/Procedures/Imaging: No  Follow-Up: Your next appointment:   Your physician recommends that you schedule a follow-up appointment in 6 months with Dr. de [place].  You will receive a text message or e-mail with a link to a survey about your care and experience with us today! We would greatly appreciate your feedback!   Thanks for letting us be apart of your health journey!!  Primary Care and Sports Medicine   Dr. Raymond de [place]   We encourage you to activate your patient portal called "MyChart".  Sign up information is provided on this After Visit Summary.  MyChart is used to connect with patients for Virtual Visits (Telemedicine).  Patients are able to view lab/test results, encounter notes, upcoming appointments, etc.  Non-urgent messages can be sent to your provider as well. To learn more about what you can do with MyChart, please visit --  [email or web address].    

## 2023-05-16 NOTE — Progress Notes (Signed)
Subjective:    CC: Annual Physical Exam  HPI:  Anne Kramer is a 52 y.o. presenting for annual physical  I reviewed the past medical history, family history, social history, surgical history, and allergies today and no changes were needed.  Please see the problem list section below in epic for further details.  Past Medical History: Past Medical History:  Diagnosis Date   Abnormal mammogram    Abnormal Pap smear of cervix    Anemia    hx after hysterectomy -no issues now   Anxiety    Cancer (HCC) 2023   thyroid   Depression    Diabetes mellitus without complication (HCC)    GERD (gastroesophageal reflux disease)    History of hiatal hernia    Hyperlipidemia    PONV (postoperative nausea and vomiting)    Restless leg syndrome    Past Surgical History: Past Surgical History:  Procedure Laterality Date   ABDOMINAL HYSTERECTOMY  2019   CHOLECYSTECTOMY  2000   COLONOSCOPY  2017   THYROIDECTOMY Bilateral 09/07/2022   Procedure: TOTAL THYROIDECTOMY;  Surgeon: Laren Boom, DO;  Location: [facility] OR;  Service: ENT;  Laterality: Bilateral;   UPPER GASTROINTESTINAL ENDOSCOPY  2017   Social History: Social History   Socioeconomic History   Marital status: Married    Spouse name: Psychologist, occupational   Number of children: 2   Years of education: Not on file   Highest education level: Associate degree: occupational, Scientist, product/process development, or vocational program  Occupational History   Not on file  Tobacco Use   Smoking status: Never   Smokeless tobacco: Never  Vaping Use   Vaping Use: Never used  Substance and Sexual Activity   Alcohol use: Yes    Comment: once a month   Drug use: Never   Sexual activity: Yes    Birth control/protection: Surgical  Other Topics Concern   Not on file  Social History Narrative   Lives at home with husband   Right handed   Caffeine: 4 caffeine drinks a day   Social Determinants of Corporate investment banker Strain: Not on file  Food Insecurity: Not  on file  Transportation Needs: Not on file  Physical Activity: Not on file  Stress: Not on file  Social Connections: Not on file   Family History: Family History  Problem Relation Age of Onset   High blood pressure Mother    High Cholesterol Mother    Depression Mother    Anxiety disorder Mother    Atrial fibrillation Mother    Thyroid disease Mother    High blood pressure Father    Heart disease Father    High Cholesterol Sister    Cancer Sister    Breast cancer Maternal Aunt    Breast cancer Paternal Grandmother    Allergies: Allergies  Allergen Reactions   Kiwi Extract Swelling    Makes lips swell   Medications: See med rec.  Review of Systems: No headache, visual changes, nausea, vomiting, diarrhea, constipation, dizziness, abdominal pain, skin rash, fevers, chills, night sweats, swollen lymph nodes, weight loss, chest pain, body aches, joint swelling, muscle aches, shortness of breath, mood changes, visual or auditory hallucinations.  Objective:    BP 126/87 (BP Location: Right Arm, Patient Position: Sitting, Cuff Size: Normal)   Pulse 72   Ht 5\' 5"  (1.651 m)   Wt 182 lb 6.4 oz (82.7 kg)   SpO2 100%   BMI 30.35 kg/m   General: Well Developed, well nourished,  and in no acute distress. Neuro: Alert and oriented x3, extra-ocular muscles intact, sensation grossly intact. Cranial nerves II through XII are intact, motor, sensory, and coordinative functions are all intact. HEENT: Normocephalic, atraumatic, pupils equal round reactive to light, neck supple, no masses, no lymphadenopathy, thyroid nonpalpable. Oropharynx, nasopharynx, external ear canals are unremarkable. Skin: Warm and dry, no rashes noted. Cardiac: Regular rate and rhythm, no murmurs rubs or gallops. Respiratory: Clear to auscultation bilaterally. Not using accessory muscles, speaking in full sentences. Abdominal: Soft, nontender, nondistended, positive bowel sounds, no masses, no  organomegaly. Musculoskeletal: Shoulder, elbow, wrist, hip, knee, ankle stable, and with full range of motion.  Impression and Recommendations:    Wellness examination Assessment & Plan: Routine HCM labs ordered. HCM reviewed/discussed. Anticipatory guidance regarding healthy weight, lifestyle and choices given. Recommend healthy diet.  Recommend approximately 150 minutes/week of moderate intensity exercise Recommend regular dental and vision exams Always use seatbelt/lap and shoulder restraints Recommend using smoke alarms and checking batteries at least twice a year Recommend using sunscreen when outside Discussed colon cancer screening recommendations, options.  Patient will consider and let us know how he would like to proceed Discussed recommendations for shingles vaccine. Discussed tetanus immunization recommendations, patient is UTD  Orders: -     CBC with Differential/Platelet -     Comprehensive metabolic panel -     Lipid panel -     Hemoglobin A1c  Colon cancer screening -     Ambulatory referral to Gastroenterology  Return in about 6 months (around 11/15/2023) for med check, hyperlipidemia.   ___________________________________________ Arieana Somoza de Peru, MD, ABFM, CAQSM Primary Care and Sports Medicine [facility]

## 2023-05-17 ENCOUNTER — Encounter (HOSPITAL_BASED_OUTPATIENT_CLINIC_OR_DEPARTMENT_OTHER): Payer: 59 | Admitting: Family Medicine

## 2023-05-17 LAB — CBC WITH DIFFERENTIAL/PLATELET
Basophils Absolute: 0.1 10*3/uL (ref 0.0–0.2)
Basos: 1 %
EOS (ABSOLUTE): 0.2 10*3/uL (ref 0.0–0.4)
Eos: 4 %
Hematocrit: 39 % (ref 34.0–46.6)
Hemoglobin: 12.7 g/dL (ref 11.1–15.9)
Immature Grans (Abs): 0 10*3/uL (ref 0.0–0.1)
Immature Granulocytes: 0 %
Lymphocytes Absolute: 1.4 10*3/uL (ref 0.7–3.1)
Lymphs: 26 %
MCH: 28.5 pg (ref 26.6–33.0)
MCHC: 32.6 g/dL (ref 31.5–35.7)
MCV: 87 fL (ref 79–97)
Monocytes Absolute: 0.3 10*3/uL (ref 0.1–0.9)
Monocytes: 5 %
Neutrophils Absolute: 3.4 10*3/uL (ref 1.4–7.0)
Neutrophils: 64 %
Platelets: 347 10*3/uL (ref 150–450)
RBC: 4.46 x10E6/uL (ref 3.77–5.28)
RDW: 12.9 % (ref 11.7–15.4)
WBC: 5.4 10*3/uL (ref 3.4–10.8)

## 2023-05-17 LAB — LIPID PANEL
Chol/HDL Ratio: 2.8 ratio (ref 0.0–4.4)
Cholesterol, Total: 181 mg/dL (ref 100–199)
HDL: 65 mg/dL (ref >39)
LDL Chol Calc (NIH): 100 mg/dL — ABNORMAL HIGH (ref 0–99)
Triglycerides: 90 mg/dL (ref 0–149)
VLDL Cholesterol Cal: 16 mg/dL (ref 5–40)

## 2023-05-17 LAB — COMPREHENSIVE METABOLIC PANEL
ALT: 15 IU/L (ref 0–32)
AST: 11 IU/L (ref 0–40)
Albumin: 4.5 g/dL (ref 3.8–4.9)
Alkaline Phosphatase: 118 IU/L (ref 44–121)
BUN/Creatinine Ratio: 12 (ref 9–23)
BUN: 11 mg/dL (ref 6–24)
Bilirubin Total: 0.5 mg/dL (ref 0.0–1.2)
CO2: 23 mmol/L (ref 20–29)
Calcium: 9.6 mg/dL (ref 8.7–10.2)
Chloride: 106 mmol/L (ref 96–106)
Creatinine, Ser: 0.93 mg/dL (ref 0.57–1.00)
Globulin, Total: 2.6 g/dL (ref 1.5–4.5)
Glucose: 92 mg/dL (ref 70–99)
Potassium: 4.8 mmol/L (ref 3.5–5.2)
Sodium: 143 mmol/L (ref 134–144)
Total Protein: 7.1 g/dL (ref 6.0–8.5)
eGFR: 74 mL/min/{1.73_m2} (ref >59)

## 2023-05-17 LAB — HEMOGLOBIN A1C
Est. average glucose Bld gHb Est-mCnc: 111 mg/dL
Hgb A1c MFr Bld: 5.5 % (ref 4.8–5.6)

## 2023-05-18 ENCOUNTER — Other Ambulatory Visit: Payer: Self-pay

## 2023-05-19 ENCOUNTER — Other Ambulatory Visit (HOSPITAL_BASED_OUTPATIENT_CLINIC_OR_DEPARTMENT_OTHER): Payer: Self-pay | Admitting: Nurse Practitioner

## 2023-05-19 DIAGNOSIS — G2581 Restless legs syndrome: Secondary | ICD-10-CM

## 2023-05-19 DIAGNOSIS — F419 Anxiety disorder, unspecified: Secondary | ICD-10-CM

## 2023-05-23 ENCOUNTER — Other Ambulatory Visit: Payer: Self-pay

## 2023-05-23 ENCOUNTER — Other Ambulatory Visit (HOSPITAL_COMMUNITY): Payer: Self-pay

## 2023-05-23 MED ORDER — BUPROPION HCL 100 MG PO TABS
100.0000 mg | ORAL_TABLET | Freq: Every day | ORAL | 1 refills | Status: DC
  Filled 2023-05-23: qty 90, 90d supply, fill #0
  Filled 2023-09-05: qty 90, 90d supply, fill #1

## 2023-05-23 MED ORDER — GABAPENTIN 100 MG PO CAPS
100.0000 mg | ORAL_CAPSULE | Freq: Every day | ORAL | 1 refills | Status: DC
  Filled 2023-05-23: qty 270, 90d supply, fill #0
  Filled 2023-09-05: qty 270, 90d supply, fill #1

## 2023-05-24 ENCOUNTER — Other Ambulatory Visit: Payer: Self-pay

## 2023-05-30 ENCOUNTER — Other Ambulatory Visit (HOSPITAL_BASED_OUTPATIENT_CLINIC_OR_DEPARTMENT_OTHER): Payer: Self-pay | Admitting: Family Medicine

## 2023-05-30 ENCOUNTER — Other Ambulatory Visit (HOSPITAL_COMMUNITY): Payer: Self-pay

## 2023-05-30 ENCOUNTER — Other Ambulatory Visit: Payer: Self-pay

## 2023-05-30 ENCOUNTER — Other Ambulatory Visit (HOSPITAL_BASED_OUTPATIENT_CLINIC_OR_DEPARTMENT_OTHER): Payer: Self-pay | Admitting: Nurse Practitioner

## 2023-05-30 DIAGNOSIS — E7849 Other hyperlipidemia: Secondary | ICD-10-CM

## 2023-05-30 MED ORDER — ESTRADIOL 0.1 MG/GM VA CREA
TOPICAL_CREAM | VAGINAL | 3 refills | Status: AC
  Filled 2023-05-30: qty 42.5, 30d supply, fill #0
  Filled 2023-07-19: qty 42.5, 30d supply, fill #1
  Filled 2023-10-07: qty 42.5, 30d supply, fill #2
  Filled 2024-01-01: qty 42.5, 30d supply, fill #3

## 2023-06-18 ENCOUNTER — Other Ambulatory Visit (HOSPITAL_BASED_OUTPATIENT_CLINIC_OR_DEPARTMENT_OTHER): Payer: Self-pay | Admitting: Nurse Practitioner

## 2023-06-18 DIAGNOSIS — F32A Depression, unspecified: Secondary | ICD-10-CM

## 2023-06-18 DIAGNOSIS — E7849 Other hyperlipidemia: Secondary | ICD-10-CM

## 2023-06-19 ENCOUNTER — Other Ambulatory Visit: Payer: Self-pay

## 2023-06-20 ENCOUNTER — Other Ambulatory Visit (HOSPITAL_COMMUNITY): Payer: Self-pay

## 2023-06-20 MED ORDER — VENLAFAXINE HCL ER 150 MG PO CP24
150.0000 mg | ORAL_CAPSULE | Freq: Every day | ORAL | 3 refills | Status: DC
  Filled 2023-06-20: qty 90, 90d supply, fill #0
  Filled 2023-09-13: qty 90, 90d supply, fill #1

## 2023-06-20 MED ORDER — ATORVASTATIN CALCIUM 20 MG PO TABS
20.0000 mg | ORAL_TABLET | Freq: Every day | ORAL | 3 refills | Status: DC
  Filled 2023-06-20: qty 90, 90d supply, fill #0
  Filled 2023-09-13: qty 90, 90d supply, fill #1

## 2023-06-23 ENCOUNTER — Other Ambulatory Visit (HOSPITAL_BASED_OUTPATIENT_CLINIC_OR_DEPARTMENT_OTHER): Payer: Self-pay | Admitting: Family Medicine

## 2023-06-23 DIAGNOSIS — E119 Type 2 diabetes mellitus without complications: Secondary | ICD-10-CM

## 2023-06-26 ENCOUNTER — Other Ambulatory Visit: Payer: Self-pay

## 2023-06-26 ENCOUNTER — Other Ambulatory Visit (HOSPITAL_COMMUNITY): Payer: Self-pay

## 2023-06-26 MED ORDER — OZEMPIC (0.25 OR 0.5 MG/DOSE) 2 MG/3ML ~~LOC~~ SOPN
PEN_INJECTOR | SUBCUTANEOUS | 6 refills | Status: DC
  Filled 2023-06-26: qty 3, 28d supply, fill #0
  Filled 2023-07-19: qty 3, 28d supply, fill #1
  Filled 2023-08-16: qty 3, 28d supply, fill #2
  Filled 2023-09-13: qty 3, 28d supply, fill #3
  Filled 2023-10-07: qty 3, 28d supply, fill #4
  Filled 2023-11-14: qty 3, 28d supply, fill #5

## 2023-07-05 DIAGNOSIS — E890 Postprocedural hypothyroidism: Secondary | ICD-10-CM | POA: Diagnosis not present

## 2023-07-05 DIAGNOSIS — C73 Malignant neoplasm of thyroid gland: Secondary | ICD-10-CM | POA: Diagnosis not present

## 2023-07-14 DIAGNOSIS — R232 Flushing: Secondary | ICD-10-CM | POA: Diagnosis not present

## 2023-07-14 DIAGNOSIS — E890 Postprocedural hypothyroidism: Secondary | ICD-10-CM | POA: Diagnosis not present

## 2023-07-14 DIAGNOSIS — C73 Malignant neoplasm of thyroid gland: Secondary | ICD-10-CM | POA: Diagnosis not present

## 2023-07-19 ENCOUNTER — Other Ambulatory Visit: Payer: Self-pay

## 2023-08-10 ENCOUNTER — Other Ambulatory Visit (HOSPITAL_BASED_OUTPATIENT_CLINIC_OR_DEPARTMENT_OTHER): Payer: Self-pay | Admitting: Nurse Practitioner

## 2023-08-10 DIAGNOSIS — K219 Gastro-esophageal reflux disease without esophagitis: Secondary | ICD-10-CM

## 2023-08-11 ENCOUNTER — Other Ambulatory Visit (HOSPITAL_COMMUNITY): Payer: Self-pay

## 2023-08-11 MED ORDER — ESOMEPRAZOLE MAGNESIUM 40 MG PO CPDR
40.0000 mg | DELAYED_RELEASE_CAPSULE | Freq: Every day | ORAL | 3 refills | Status: DC
  Filled 2023-08-11: qty 90, 90d supply, fill #0
  Filled 2023-11-14: qty 90, 90d supply, fill #1
  Filled 2024-02-07: qty 90, 90d supply, fill #2
  Filled 2024-05-10: qty 90, 90d supply, fill #3

## 2023-08-16 ENCOUNTER — Other Ambulatory Visit: Payer: Self-pay

## 2023-08-22 DIAGNOSIS — C73 Malignant neoplasm of thyroid gland: Secondary | ICD-10-CM | POA: Diagnosis not present

## 2023-08-22 DIAGNOSIS — R232 Flushing: Secondary | ICD-10-CM | POA: Diagnosis not present

## 2023-08-22 DIAGNOSIS — E890 Postprocedural hypothyroidism: Secondary | ICD-10-CM | POA: Diagnosis not present

## 2023-09-04 ENCOUNTER — Other Ambulatory Visit (HOSPITAL_COMMUNITY): Payer: Self-pay

## 2023-09-05 ENCOUNTER — Other Ambulatory Visit (HOSPITAL_COMMUNITY): Payer: Self-pay

## 2023-09-13 ENCOUNTER — Other Ambulatory Visit: Payer: Self-pay

## 2023-10-07 ENCOUNTER — Other Ambulatory Visit (HOSPITAL_COMMUNITY): Payer: Self-pay

## 2023-10-09 ENCOUNTER — Other Ambulatory Visit: Payer: Self-pay

## 2023-10-19 ENCOUNTER — Encounter (HOSPITAL_BASED_OUTPATIENT_CLINIC_OR_DEPARTMENT_OTHER): Payer: Self-pay | Admitting: Family Medicine

## 2023-11-08 DIAGNOSIS — E890 Postprocedural hypothyroidism: Secondary | ICD-10-CM | POA: Diagnosis not present

## 2023-11-08 DIAGNOSIS — C73 Malignant neoplasm of thyroid gland: Secondary | ICD-10-CM | POA: Diagnosis not present

## 2023-11-14 ENCOUNTER — Other Ambulatory Visit (HOSPITAL_COMMUNITY): Payer: Self-pay

## 2023-11-14 ENCOUNTER — Other Ambulatory Visit: Payer: Self-pay

## 2023-11-17 ENCOUNTER — Encounter (HOSPITAL_BASED_OUTPATIENT_CLINIC_OR_DEPARTMENT_OTHER): Payer: Self-pay | Admitting: Family Medicine

## 2023-11-17 ENCOUNTER — Other Ambulatory Visit (HOSPITAL_COMMUNITY): Payer: Self-pay

## 2023-11-17 ENCOUNTER — Other Ambulatory Visit: Payer: Self-pay

## 2023-11-17 ENCOUNTER — Ambulatory Visit (HOSPITAL_BASED_OUTPATIENT_CLINIC_OR_DEPARTMENT_OTHER): Payer: Commercial Managed Care - PPO | Admitting: Family Medicine

## 2023-11-17 VITALS — BP 119/94 | HR 91 | Ht 65.0 in | Wt 187.2 lb

## 2023-11-17 DIAGNOSIS — Z7985 Long-term (current) use of injectable non-insulin antidiabetic drugs: Secondary | ICD-10-CM | POA: Diagnosis not present

## 2023-11-17 DIAGNOSIS — F419 Anxiety disorder, unspecified: Secondary | ICD-10-CM

## 2023-11-17 DIAGNOSIS — E7849 Other hyperlipidemia: Secondary | ICD-10-CM | POA: Diagnosis not present

## 2023-11-17 DIAGNOSIS — R7303 Prediabetes: Secondary | ICD-10-CM

## 2023-11-17 DIAGNOSIS — E119 Type 2 diabetes mellitus without complications: Secondary | ICD-10-CM | POA: Diagnosis not present

## 2023-11-17 DIAGNOSIS — F32A Depression, unspecified: Secondary | ICD-10-CM | POA: Diagnosis not present

## 2023-11-17 DIAGNOSIS — G2581 Restless legs syndrome: Secondary | ICD-10-CM

## 2023-11-17 DIAGNOSIS — Z1211 Encounter for screening for malignant neoplasm of colon: Secondary | ICD-10-CM

## 2023-11-17 DIAGNOSIS — Z0000 Encounter for general adult medical examination without abnormal findings: Secondary | ICD-10-CM

## 2023-11-17 MED ORDER — OZEMPIC (0.25 OR 0.5 MG/DOSE) 2 MG/3ML ~~LOC~~ SOPN
0.5000 mg | PEN_INJECTOR | SUBCUTANEOUS | 6 refills | Status: AC
  Filled 2023-11-17: qty 3, fill #0
  Filled 2023-11-27 – 2023-12-07 (×2): qty 3, 28d supply, fill #0
  Filled 2024-01-01: qty 3, 28d supply, fill #1
  Filled 2024-01-27: qty 3, 28d supply, fill #2
  Filled 2024-03-01: qty 3, 28d supply, fill #3
  Filled 2024-05-10: qty 3, 28d supply, fill #4
  Filled 2024-06-05: qty 3, 28d supply, fill #5
  Filled 2024-08-03: qty 3, 28d supply, fill #6

## 2023-11-17 MED ORDER — VENLAFAXINE HCL ER 150 MG PO CP24
150.0000 mg | ORAL_CAPSULE | Freq: Every day | ORAL | 3 refills | Status: DC
  Filled 2023-11-17 – 2023-12-12 (×2): qty 90, 90d supply, fill #0
  Filled 2024-03-13: qty 90, 90d supply, fill #1
  Filled 2024-06-07: qty 90, 90d supply, fill #2
  Filled 2024-09-07: qty 90, 90d supply, fill #3

## 2023-11-17 MED ORDER — GABAPENTIN 100 MG PO CAPS
100.0000 mg | ORAL_CAPSULE | Freq: Every day | ORAL | 1 refills | Status: DC
  Filled 2023-11-17 – 2023-12-12 (×2): qty 270, 90d supply, fill #0
  Filled 2024-05-10: qty 270, 90d supply, fill #1

## 2023-11-17 MED ORDER — ATORVASTATIN CALCIUM 20 MG PO TABS
20.0000 mg | ORAL_TABLET | Freq: Every day | ORAL | 3 refills | Status: DC
  Filled 2023-11-17 – 2023-12-12 (×2): qty 90, 90d supply, fill #0
  Filled 2024-03-13: qty 90, 90d supply, fill #1
  Filled 2024-06-07: qty 90, 90d supply, fill #2
  Filled 2024-09-07: qty 90, 90d supply, fill #3

## 2023-11-17 MED ORDER — BUPROPION HCL 100 MG PO TABS
100.0000 mg | ORAL_TABLET | Freq: Every day | ORAL | 1 refills | Status: DC
  Filled 2023-11-17: qty 90, 90d supply, fill #0
  Filled 2024-03-01: qty 90, 90d supply, fill #1

## 2023-11-17 NOTE — Assessment & Plan Note (Signed)
Doing well with atorvastatin, denies any issues today.  No reported concerns or issues with myalgias.  Requesting refill today, refill sent to pharmacy on file

## 2023-11-17 NOTE — Assessment & Plan Note (Signed)
Partially utilizes gabapentin for control of RLS symptoms.  Reports that she has been doing well in this regard and is requesting refill today.  Refill sent to pharmacy on file

## 2023-11-17 NOTE — Progress Notes (Signed)
    Procedures performed today:    None.  Independent interpretation of notes and tests performed by another provider:   None.  Brief History, Exam, Impression, and Recommendations:    BP (!) 119/94 (BP Location: Right Arm, Patient Position: Sitting, Cuff Size: Normal)   Pulse 91   Ht 5\' 5"  (1.651 m)   Wt 187 lb 3.2 oz (84.9 kg)   SpO2 99%   BMI 31.15 kg/m   Patient presents for routine follow-up.  Denies any acute issues today.  Generally feels that she has been doing well on current medication regimen.  Requesting refill of medications today.  Special screening for malignant neoplasms, colon -     Ambulatory referral to Gastroenterology  Pre-diabetes Assessment & Plan: Continue to focus on lifestyle modifications, most recent hemoglobin A1c improved and is within normal range.  Recommend continuing with modifications made and we will continue with intermittent monitoring of hemoglobin A1c.  Orders: -     Microalbumin / creatinine urine ratio  Other hyperlipidemia Assessment & Plan: Doing well with atorvastatin, denies any issues today.  No reported concerns or issues with myalgias.  Requesting refill today, refill sent to pharmacy on file  Orders: -     Atorvastatin Calcium; Take 1 tablet (20 mg total) by mouth daily.  Dispense: 90 tablet; Refill: 3  Anxiety and depression Assessment & Plan: Patient continues with bupropion, venlafaxine, gabapentin.  Denies any issues or concerns with medications,, requesting refill today.  Refill of medication sent to pharmacy on file.  Orders: -     buPROPion HCl; Take 1 tablet (100 mg total) by mouth daily.  Dispense: 90 tablet; Refill: 1 -     Gabapentin; Take 1-3 capsules (100-300 mg total) by mouth at bedtime.  Dispense: 270 capsule; Refill: 1 -     Venlafaxine HCl ER; Take 1 capsule by mouth daily with breakfast.  Dispense: 90 capsule; Refill: 3  RLS (restless legs syndrome) Assessment & Plan: Partially utilizes gabapentin  for control of RLS symptoms.  Reports that she has been doing well in this regard and is requesting refill today.  Refill sent to pharmacy on file  Orders: -     Gabapentin; Take 1-3 capsules (100-300 mg total) by mouth at bedtime.  Dispense: 270 capsule; Refill: 1  Controlled type 2 diabetes mellitus without complication, without long-term current use of insulin (HCC) -     Ozempic (0.25 or 0.5 MG/DOSE); Inject 0.5 mg into the skin once a week.  Dispense: 3 mL; Refill: 6  Wellness examination -     CBC with Differential/Platelet; Future -     Comprehensive metabolic panel; Future -     Hemoglobin A1c; Future -     Lipid panel; Future -     TSH; Future  Return in about 6 months (around 05/17/2024) for CPE with fasting labs 1 week prior.   ___________________________________________ Anne Macknight de Peru, MD, ABFM, CAQSM Primary Care and Sports Medicine [facility]

## 2023-11-17 NOTE — Assessment & Plan Note (Signed)
Patient continues with bupropion, venlafaxine, gabapentin.  Denies any issues or concerns with medications,, requesting refill today.  Refill of medication sent to pharmacy on file.

## 2023-11-17 NOTE — Patient Instructions (Signed)
  Medication Instructions:  Your physician recommends that you continue on your current medications as directed. Please refer to the Current Medication list given to you today. --If you need a refill on any your medications before your next appointment, please call your pharmacy first. If no refills are authorized on file call the office.-- Lab Work: Your physician has recommended that you have lab work today: 1 week before next visit If you have labs (blood work) drawn today and your tests are completely normal, you will receive your results via MyChart message OR a phone call from our staff.  Please ensure you check your voicemail in the event that you authorized detailed messages to be left on a delegated number. If you have any lab test that is abnormal or we need to change your treatment, we will call you to review the results.    Follow-Up: Your next appointment:   Your physician recommends that you schedule a follow-up appointment in: 6 months physical  with Dr. de Peru  You will receive a text message or e-mail with a link to a survey about your care and experience with [place] today! We would greatly appreciate your feedback!   Thanks for letting us be apart of your health journey!!  Primary Care and Sports Medicine   Dr. Ceasar Mons Peru   We encourage you to activate your patient portal called "MyChart".  Sign up information is provided on this After Visit Summary.  MyChart is used to connect with patients for Virtual Visits (Telemedicine).  Patients are able to view lab/test results, encounter notes, upcoming appointments, etc.  Non-urgent messages can be sent to your provider as well. To learn more about what you can do with MyChart, please visit --  [email or web address].

## 2023-11-17 NOTE — Assessment & Plan Note (Signed)
Continue to focus on lifestyle modifications, most recent hemoglobin A1c improved and is within normal range.  Recommend continuing with modifications made and we will continue with intermittent monitoring of hemoglobin A1c.

## 2023-11-20 ENCOUNTER — Encounter (INDEPENDENT_AMBULATORY_CARE_PROVIDER_SITE_OTHER): Payer: Self-pay | Admitting: *Deleted

## 2023-11-21 LAB — MICROALBUMIN / CREATININE URINE RATIO
Creatinine, Urine: 129.1 mg/dL
Microalb/Creat Ratio: 3 mg/g{creat} (ref 0–29)
Microalbumin, Urine: 3.5 ug/mL

## 2023-11-27 ENCOUNTER — Other Ambulatory Visit: Payer: Self-pay

## 2023-11-27 ENCOUNTER — Other Ambulatory Visit (HOSPITAL_BASED_OUTPATIENT_CLINIC_OR_DEPARTMENT_OTHER): Payer: Self-pay

## 2023-12-07 ENCOUNTER — Other Ambulatory Visit: Payer: Self-pay

## 2023-12-07 ENCOUNTER — Other Ambulatory Visit (HOSPITAL_COMMUNITY): Payer: Self-pay

## 2023-12-08 ENCOUNTER — Encounter: Payer: Self-pay | Admitting: Internal Medicine

## 2023-12-12 ENCOUNTER — Other Ambulatory Visit (HOSPITAL_COMMUNITY): Payer: Self-pay

## 2023-12-12 ENCOUNTER — Other Ambulatory Visit: Payer: Self-pay

## 2023-12-21 ENCOUNTER — Ambulatory Visit: Payer: Commercial Managed Care - PPO | Admitting: Internal Medicine

## 2023-12-21 ENCOUNTER — Encounter: Payer: Self-pay | Admitting: Internal Medicine

## 2023-12-21 ENCOUNTER — Telehealth: Payer: Self-pay | Admitting: *Deleted

## 2023-12-21 VITALS — BP 131/89 | HR 72 | Temp 97.8°F | Ht 65.0 in | Wt 187.2 lb

## 2023-12-21 DIAGNOSIS — K219 Gastro-esophageal reflux disease without esophagitis: Secondary | ICD-10-CM | POA: Diagnosis not present

## 2023-12-21 DIAGNOSIS — K5900 Constipation, unspecified: Secondary | ICD-10-CM

## 2023-12-21 DIAGNOSIS — R109 Unspecified abdominal pain: Secondary | ICD-10-CM | POA: Diagnosis not present

## 2023-12-21 DIAGNOSIS — Z1211 Encounter for screening for malignant neoplasm of colon: Secondary | ICD-10-CM

## 2023-12-21 DIAGNOSIS — K581 Irritable bowel syndrome with constipation: Secondary | ICD-10-CM

## 2023-12-21 NOTE — Progress Notes (Signed)
Primary Care Physician:  de Peru, Buren Kos, MD Primary Gastroenterologist:  Dr. Marletta Lor  Chief Complaint  Patient presents with   New Patient (Initial Visit)    Pt here for ov due to constipation for colonscopy    HPI:   Anne Kramer is a 53 y.o. female who presents to the clinic today by referral from her PCP Dr. de Peru for evaluation.  Chronic GERD: Well-controlled on esomeprazole daily.  No dysphagia odynophagia.  No epigastric or chest pain.  States she had an upper endoscopy over 10 years ago which was WNL.  This was done in [place].  Constipation, abdominal pain: Patient states she has had issues with constipation for many years.  Has trialed on MiraLAX and Colace which did not help.  States she will go up to 7 days without having a bowel movement.  She will then have multiple bowel movements with occasional incomplete evacuation.  Occasional straining.  No rectal bleeding.  No mucus in stool.  Does note associated abdominal pain related to her constipation primarily lower.  Does have relief of her abdominal pain if she has a "good" bowel movement.  Colon cancer screening: Due for screening colonoscopy today.  States she had a colonoscopy over 10 years ago at the same time as upper endoscopy.  Reports this was WNL although I do not have access to this report.  No family history of colorectal malignancy.  No melena hematochezia.  No unintentional weight loss.  Past Medical History:  Diagnosis Date   Abnormal mammogram    Abnormal Pap smear of cervix    Anemia    hx after hysterectomy -no issues now   Anxiety    Cancer (HCC) 2023   thyroid   Depression    Diabetes mellitus without complication (HCC)    GERD (gastroesophageal reflux disease)    History of hiatal hernia    Hyperlipidemia    PONV (postoperative nausea and vomiting)    Restless leg syndrome     Past Surgical History:  Procedure Laterality Date   ABDOMINAL HYSTERECTOMY  2019   CHOLECYSTECTOMY   2000   COLONOSCOPY  2017   THYROIDECTOMY Bilateral 09/07/2022   Procedure: TOTAL THYROIDECTOMY;  Surgeon: Laren Boom, DO;  Location: [facility] OR;  Service: ENT;  Laterality: Bilateral;   UPPER GASTROINTESTINAL ENDOSCOPY  2017    Current Outpatient Medications  Medication Sig Dispense Refill   atorvastatin (LIPITOR) 20 MG tablet Take 1 tablet (20 mg total) by mouth daily. 90 tablet 3   blood glucose meter kit and supplies Dispense based on patient and insurance preference. Use up to four times daily as directed. (FOR ICD-10 R73.03, Z68.38) 1 each 0   buPROPion (WELLBUTRIN) 100 MG tablet Take 1 tablet (100 mg total) by mouth daily. 90 tablet 1   esomeprazole (NEXIUM) 40 MG capsule Take 1 capsule (40 mg total) by mouth daily. 90 capsule 3   estradiol (ESTRACE VAGINAL) 0.1 MG/GM vaginal cream Apply 1/2 inch nightly for 2 weeks followed by twice a week thereafter 42.5 g 3   gabapentin (NEURONTIN) 100 MG capsule Take 1-3 capsules (100-300 mg total) by mouth at bedtime. 270 capsule 1   levothyroxine (SYNTHROID) 112 MCG tablet Take 112 mcg by mouth daily.     Semaglutide,0.25 or 0.5MG /DOS, (OZEMPIC, 0.25 OR 0.5 MG/DOSE,) 2 MG/3ML SOPN Inject 0.5 mg into the skin once a week. 3 mL 6   venlafaxine XR (EFFEXOR-XR) 150 MG 24 hr capsule Take 1 capsule (150  mg total) by mouth daily with breakfast. 90 capsule 3   No current facility-administered medications for this visit.    Allergies as of 12/21/2023 - Review Complete 12/21/2023  Allergen Reaction Noted   Kiwi extract Swelling 12/06/2021    Family History  Problem Relation Age of Onset   High blood pressure Mother    High Cholesterol Mother    Depression Mother    Anxiety disorder Mother    Atrial fibrillation Mother    Thyroid disease Mother    High blood pressure Father    Heart disease Father    High Cholesterol Sister    Cancer Sister    Breast cancer Maternal Aunt    Breast cancer Paternal Grandmother     Social History    Socioeconomic History   Marital status: Married    Spouse name: Asher Muir   Number of children: 2   Years of education: Not on file   Highest education level: Associate degree: occupational, Scientist, product/process development, or vocational program  Occupational History   Not on file  Tobacco Use   Smoking status: Never    Passive exposure: Never   Smokeless tobacco: Never  Vaping Use   Vaping status: Never Used  Substance and Sexual Activity   Alcohol use: Yes    Comment: once a month   Drug use: Never   Sexual activity: Yes    Birth control/protection: Surgical  Other Topics Concern   Not on file  Social History Narrative   Lives at home with husband   Right handed   Caffeine: 4 caffeine drinks a day   Social Drivers of Corporate investment banker Strain: Not on file  Food Insecurity: Not on file  Transportation Needs: Not on file  Physical Activity: Not on file  Stress: Not on file  Social Connections: Unknown (04/12/2022)   Received from [facility], [facility]   Social Network    Social Network: Not on file  Intimate Partner Violence: Unknown (03/04/2022)   Received from [facility], [facility]   HITS    Physically Hurt: Not on file    Insult or Talk Down To: Not on file    Threaten Physical Harm: Not on file    Scream or Curse: Not on file    Subjective: Review of Systems  Constitutional:  Negative for chills and fever.  HENT:  Negative for congestion and hearing loss.   Eyes:  Negative for blurred vision and double vision.  Respiratory:  Negative for cough and shortness of breath.   Cardiovascular:  Negative for chest pain and palpitations.  Gastrointestinal:  Positive for abdominal pain and constipation. Negative for blood in stool, diarrhea, heartburn, melena and vomiting.  Genitourinary:  Negative for dysuria and urgency.  Musculoskeletal:  Negative for joint pain and myalgias.  Skin:  Negative for itching and rash.  Neurological:  Negative for dizziness and  headaches.  Psychiatric/Behavioral:  Negative for depression. The patient is not nervous/anxious.        Objective: BP 131/89   Pulse 72   Temp 97.8 F (36.6 C)   Ht 5\' 5"  (1.651 m)   Wt 187 lb 3.2 oz (84.9 kg)   BMI 31.15 kg/m  Physical Exam Constitutional:      Appearance: Normal appearance.  HENT:     Head: Normocephalic and atraumatic.  Eyes:     Extraocular Movements: Extraocular movements intact.     Conjunctiva/sclera: Conjunctivae normal.  Cardiovascular:     Rate and Rhythm: Normal  rate and regular rhythm.  Pulmonary:     Effort: Pulmonary effort is normal.     Breath sounds: Normal breath sounds.  Abdominal:     General: Bowel sounds are normal.     Palpations: Abdomen is soft.  Musculoskeletal:        General: No swelling. Normal range of motion.     Cervical back: Normal range of motion and neck supple.  Skin:    General: Skin is warm and dry.     Coloration: Skin is not jaundiced.  Neurological:     General: No focal deficit present.     Mental Status: She is alert and oriented to person, place, and time.  Psychiatric:        Mood and Affect: Mood normal.        Behavior: Behavior normal.      Assessment/Plan:  1.  Chronic GERD: well controlled on esomeprazole daily.  Will continue.  No alarm symptoms today to warrant further investigation with upper endoscopy.  2.  Abdominal pain, constipation: Patient symptoms consistent with irritable bowel syndrome, constipation predominant.  Trialed and failed over-the-counter MiraLAX and Colace.  Will give samples of Linzess 290 mcg daily.  Counseled on initial washout and she understands.  Call with update next week and if symptoms improved I will send in formal prescription.  3.  Colon cancer screening: Will schedule for screening colonoscopy.The risks including infection, bleed, or perforation as well as benefits, limitations, alternatives and imponderables have been reviewed with the patient. Questions have  been answered. All parties agreeable.  Thank you Dr. De Peru for the kind referral.  12/21/2023 9:47 AM   Disclaimer: This note was dictated with voice recognition software. Similar sounding words can inadvertently be transcribed and may not be corrected upon review.

## 2023-12-21 NOTE — Patient Instructions (Signed)
Continue on esomeprazole for your chronic reflux.  We will schedule you for colonoscopy for colon cancer screening purposes.  For your constipation, I am going to give you samples of Linzess 290 mcg daily.  You may have an initial washout period with this medication, continue taking it.  Let me know in a week or so if it is helping and I can send in formal prescription.  Linzess works best when taken once a day every day, on an empty stomach, at least 30 minutes before your first meal of the day.  When Linzess is taken daily as directed:  *Constipation relief is typically felt in about a week *IBS-C patients may begin to experience relief from belly pain and overall abdominal symptoms (pain, discomfort, and bloating) in about 1 week,   with symptoms typically improving over 12 weeks.  Diarrhea may occur in the first 2 weeks -keep taking it.  The diarrhea should go away and you should start having normal, complete, full bowel movements. It may be helpful to start treatment when you can be near the comfort of your own bathroom, such as a weekend.   It was very nice meeting you today.  Dr. Marletta Lor

## 2023-12-21 NOTE — Telephone Encounter (Signed)
LMOVM to call back to schedule TCS with Dr. Marletta Lor, ASA 2, no ozempic x 7 days

## 2023-12-28 ENCOUNTER — Encounter (HOSPITAL_BASED_OUTPATIENT_CLINIC_OR_DEPARTMENT_OTHER): Payer: Self-pay

## 2023-12-29 ENCOUNTER — Encounter: Payer: Self-pay | Admitting: Internal Medicine

## 2024-01-01 ENCOUNTER — Other Ambulatory Visit: Payer: Self-pay

## 2024-01-02 ENCOUNTER — Other Ambulatory Visit: Payer: Self-pay

## 2024-01-03 ENCOUNTER — Telehealth: Payer: Self-pay

## 2024-01-03 NOTE — Telephone Encounter (Signed)
 Dr Mordechai April,  Pt is requesting a formal Rx of Linzess  to be sent to her pharmacy. Please advise

## 2024-01-05 ENCOUNTER — Other Ambulatory Visit: Payer: Self-pay | Admitting: Internal Medicine

## 2024-01-05 MED ORDER — LINACLOTIDE 290 MCG PO CAPS: 290.0000 ug | ORAL_CAPSULE | Freq: Every day | ORAL | 3 refills | Status: AC

## 2024-01-05 NOTE — Telephone Encounter (Signed)
 Sent to pharmacy

## 2024-01-06 ENCOUNTER — Other Ambulatory Visit (HOSPITAL_COMMUNITY): Payer: Self-pay

## 2024-01-06 MED ORDER — LINACLOTIDE 290 MCG PO CAPS
290.0000 ug | ORAL_CAPSULE | Freq: Every day | ORAL | 3 refills | Status: AC
  Filled 2024-01-06: qty 90, 90d supply, fill #0
  Filled 2024-05-10: qty 90, 90d supply, fill #1

## 2024-01-17 DIAGNOSIS — C73 Malignant neoplasm of thyroid gland: Secondary | ICD-10-CM | POA: Diagnosis not present

## 2024-01-17 DIAGNOSIS — E890 Postprocedural hypothyroidism: Secondary | ICD-10-CM | POA: Diagnosis not present

## 2024-01-29 ENCOUNTER — Other Ambulatory Visit: Payer: Self-pay

## 2024-01-31 ENCOUNTER — Encounter: Payer: Self-pay | Admitting: *Deleted

## 2024-02-01 DIAGNOSIS — E890 Postprocedural hypothyroidism: Secondary | ICD-10-CM | POA: Diagnosis not present

## 2024-02-01 DIAGNOSIS — C73 Malignant neoplasm of thyroid gland: Secondary | ICD-10-CM | POA: Diagnosis not present

## 2024-02-07 ENCOUNTER — Other Ambulatory Visit (HOSPITAL_COMMUNITY): Payer: Self-pay

## 2024-02-07 MED ORDER — CLENPIQ 10-3.5-12 MG-GM -GM/175ML PO SOLN: 1.0000 | ORAL | 0 refills | Status: DC

## 2024-02-20 ENCOUNTER — Other Ambulatory Visit (HOSPITAL_COMMUNITY): Payer: Self-pay

## 2024-03-01 ENCOUNTER — Other Ambulatory Visit (HOSPITAL_COMMUNITY): Payer: Self-pay

## 2024-03-01 ENCOUNTER — Other Ambulatory Visit: Payer: Self-pay

## 2024-03-05 ENCOUNTER — Ambulatory Visit (HOSPITAL_COMMUNITY): Admitting: Anesthesiology

## 2024-03-05 ENCOUNTER — Ambulatory Visit (HOSPITAL_COMMUNITY)
Admission: RE | Admit: 2024-03-05 | Discharge: 2024-03-05 | Disposition: A | Discharge status: Home / Self Care | Attending: Internal Medicine | Admitting: Internal Medicine

## 2024-03-05 ENCOUNTER — Encounter (HOSPITAL_COMMUNITY)
Admission: RE | Disposition: A | Discharge status: Home / Self Care | Payer: Self-pay | Source: Home / Self Care | Attending: Internal Medicine

## 2024-03-05 ENCOUNTER — Other Ambulatory Visit: Payer: Self-pay

## 2024-03-05 ENCOUNTER — Encounter (HOSPITAL_COMMUNITY): Payer: Self-pay | Admitting: Internal Medicine

## 2024-03-05 DIAGNOSIS — E119 Type 2 diabetes mellitus without complications: Secondary | ICD-10-CM | POA: Insufficient documentation

## 2024-03-05 DIAGNOSIS — K648 Other hemorrhoids: Secondary | ICD-10-CM

## 2024-03-05 DIAGNOSIS — I10 Essential (primary) hypertension: Secondary | ICD-10-CM | POA: Insufficient documentation

## 2024-03-05 DIAGNOSIS — K449 Diaphragmatic hernia without obstruction or gangrene: Secondary | ICD-10-CM | POA: Diagnosis not present

## 2024-03-05 DIAGNOSIS — K219 Gastro-esophageal reflux disease without esophagitis: Secondary | ICD-10-CM | POA: Diagnosis not present

## 2024-03-05 DIAGNOSIS — K5730 Diverticulosis of large intestine without perforation or abscess without bleeding: Secondary | ICD-10-CM | POA: Diagnosis not present

## 2024-03-05 DIAGNOSIS — E039 Hypothyroidism, unspecified: Secondary | ICD-10-CM | POA: Diagnosis not present

## 2024-03-05 DIAGNOSIS — F32A Depression, unspecified: Secondary | ICD-10-CM | POA: Diagnosis not present

## 2024-03-05 DIAGNOSIS — K649 Unspecified hemorrhoids: Secondary | ICD-10-CM | POA: Diagnosis not present

## 2024-03-05 DIAGNOSIS — F419 Anxiety disorder, unspecified: Secondary | ICD-10-CM | POA: Diagnosis not present

## 2024-03-05 DIAGNOSIS — Z1211 Encounter for screening for malignant neoplasm of colon: Secondary | ICD-10-CM

## 2024-03-05 DIAGNOSIS — Z7985 Long-term (current) use of injectable non-insulin antidiabetic drugs: Secondary | ICD-10-CM | POA: Insufficient documentation

## 2024-03-05 HISTORY — PX: COLONOSCOPY: SHX5424

## 2024-03-05 LAB — GLUCOSE, CAPILLARY: Glucose-Capillary: 87 mg/dL (ref 70–99)

## 2024-03-05 SURGERY — COLONOSCOPY
Anesthesia: General

## 2024-03-05 MED ORDER — PROPOFOL 10 MG/ML IV BOLUS
INTRAVENOUS | Status: DC | PRN
  Administered 2024-03-05: 125 ug/kg/min via INTRAVENOUS

## 2024-03-05 MED ORDER — LACTATED RINGERS IV SOLN: INTRAVENOUS | Status: DC

## 2024-03-05 MED ORDER — SODIUM CHLORIDE 0.9 % IV SOLN: INTRAVENOUS | Status: DC | PRN

## 2024-03-05 NOTE — Op Note (Signed)
 [facility] Patient Name: Anne Kramer Procedure Date: 03/05/2024 8:57 AM MRN: [id number] Date of Birth: 1970/12/02 Attending MD: Hennie Duos. Marletta Lor , [place], [id number] CSN: [id number] Age: 53 Admit Type: Outpatient Procedure:                Colonoscopy Indications:              Screening for colorectal malignant neoplasm Providers:                Hennie Duos. Marletta Lor, DO, Crystal Page, [employer].                            Jessee Avers, Technician Referring MD:              Medicines:                See the Anesthesia note for documentation of the                            administered medications Complications:            No immediate complications. Estimated Blood Loss:     Estimated blood loss: none. Procedure:                Pre-Anesthesia Assessment:                           - The anesthesia plan was to use monitored                            anesthesia care (MAC).                           After obtaining informed consent, the colonoscope                            was passed under direct vision. Throughout the                            procedure, the patient's blood pressure, pulse, and                            oxygen saturations were monitored continuously. The                            PCF-HQ190L ([id number]) scope was introduced through                            the anus and advanced to the the cecum, identified                            by appendiceal orifice and ileocecal valve. The                            colonoscopy was performed without difficulty. The                            patient tolerated the procedure well.  The quality                            of the bowel preparation was evaluated using the                            BBPS [facility] Bowel Preparation Scale) with scores                            of: Right Colon = 2 (minor amount of residual                            staining, small fragments of stool and/or opaque                            liquid,  but mucosa seen well), Transverse Colon = 2                            (minor amount of residual staining, small fragments                            of stool and/or opaque liquid, but mucosa seen                            well) and Left Colon = 2 (minor amount of residual                            staining, small fragments of stool and/or opaque                            liquid, but mucosa seen well). The total BBPS score                            equals 6. The quality of the bowel preparation was                            good. Scope In: 9:16:03 AM Scope Out: 9:30:47 AM Scope Withdrawal Time: 0 hours 11 minutes 7 seconds  Total Procedure Duration: 0 hours 14 minutes 44 seconds  Findings:      Non-bleeding internal hemorrhoids were found during endoscopy.      Multiple small-mouthed diverticula were found in the sigmoid colon and       descending colon.      The exam was otherwise without abnormality. Impression:               - Non-bleeding internal hemorrhoids.                           - Diverticulosis in the sigmoid colon and in the                            descending colon.                           -  The examination was otherwise normal.                           - No specimens collected. Moderate Sedation:      Per Anesthesia Care Recommendation:           - Patient has a contact number available for                            emergencies. The signs and symptoms of potential                            delayed complications were discussed with the                            patient. Return to normal activities tomorrow.                            Written discharge instructions were provided to the                            patient.                           - Resume previous diet.                           - Continue present medications.                           - Repeat colonoscopy in 10 years for screening                            purposes.                            - Return to GI clinic in 4 months. Procedure Code(s):        --- Professional ---                           W0981, Colorectal cancer screening; colonoscopy on                            individual not meeting criteria for high risk Diagnosis Code(s):        --- Professional ---                           Z12.11, Encounter for screening for malignant                            neoplasm of colon                           K64.8, Other hemorrhoids                           K57.30, Diverticulosis of large intestine without  perforation or abscess without bleeding CPT copyright 2022 American Medical Association. All rights reserved. The codes documented in this report are preliminary and upon coder review may  be revised to meet current compliance requirements. Hennie Duos. Marletta Lor, DO Hennie Duos. Marletta Lor, DO 03/05/2024 9:34:57 AM This report has been signed electronically. Number of Addenda: 0

## 2024-03-05 NOTE — H&P (Signed)
 Primary Care Physician:  de [place], Buren Kos, MD Primary Gastroenterologist:  Dr. Marletta Lor  Pre-Procedure History & Physical: HPI:  Anne Kramer is a 53 y.o. female is here for a colonoscopy for colon cancer screening purposes.   Past Medical History:  Diagnosis Date   Abnormal mammogram    Abnormal Pap smear of cervix    Anemia    hx after hysterectomy -no issues now   Anxiety    Cancer (HCC) 2023   thyroid   Depression    Diabetes mellitus without complication (HCC)    GERD (gastroesophageal reflux disease)    History of hiatal hernia    Hyperlipidemia    PONV (postoperative nausea and vomiting)    Restless leg syndrome     Past Surgical History:  Procedure Laterality Date   ABDOMINAL HYSTERECTOMY  2019   CHOLECYSTECTOMY  2000   COLONOSCOPY  2017   THYROIDECTOMY Bilateral 09/07/2022   Procedure: TOTAL THYROIDECTOMY;  Surgeon: Laren Boom, DO;  Location: [facility] OR;  Service: ENT;  Laterality: Bilateral;   UPPER GASTROINTESTINAL ENDOSCOPY  2017    Prior to Admission medications   Medication Sig Start Date End Date Taking? Authorizing Provider  atorvastatin (LIPITOR) 20 MG tablet Take 1 tablet (20 mg total) by mouth daily. 11/17/23  Yes de [place], Raymond J, MD  buPROPion (WELLBUTRIN) 100 MG tablet Take 1 tablet (100 mg total) by mouth daily. 11/17/23  Yes de [place], Raymond J, MD  esomeprazole (NEXIUM) 40 MG capsule Take 1 capsule (40 mg total) by mouth daily. 08/11/23  Yes de [place], Raymond J, MD  gabapentin (NEURONTIN) 100 MG capsule Take 1-3 capsules (100-300 mg total) by mouth at bedtime. 11/17/23  Yes de [place], Raymond J, MD  levothyroxine (SYNTHROID) 112 MCG tablet Take 112 mcg by mouth daily.   Yes [provider]  linaclotide Karlene Einstein) 290 MCG CAPS capsule Take 1 capsule (290 mcg total) by mouth daily before breakfast. 01/05/24 01/04/25 Yes Marquest Gunkel, Hennie Duos, DO  linaclotide [facility]) 290 MCG CAPS capsule Take 1 capsule (290 mcg total) by mouth daily before  breakfast. 01/05/24  Yes Rexine Gowens, Hennie Duos, DO  Sod Picosulfate-Mag Ox-Cit Acd (CLENPIQ) 10-3.5-12 MG-GM -GM/175ML SOLN Take 1 kit by mouth as directed. 02/07/24  Yes Lanelle Bal, DO  venlafaxine XR (EFFEXOR-XR) 150 MG 24 hr capsule Take 1 capsule (150 mg total) by mouth daily with breakfast. 11/17/23  Yes de [place], Buren Kos, MD  blood glucose meter kit and supplies Dispense based on patient and insurance preference. Use up to four times daily as directed. (FOR ICD-10 R73.03, Z68.38) 05/25/21   Early, Sung Amabile, NP  estradiol (ESTRACE VAGINAL) 0.1 MG/GM vaginal cream Apply 1/2 inch nightly for 2 weeks followed by twice a week thereafter 05/29/23     Semaglutide,0.25 or 0.5MG /DOS, (OZEMPIC, 0.25 OR 0.5 MG/DOSE,) 2 MG/3ML SOPN Inject 0.5 mg into the skin once a week. 11/17/23   de [place], Raymond J, MD    Allergies as of 02/07/2024 - Review Complete 12/21/2023  Allergen Reaction Noted   Kiwi extract Swelling 12/06/2021    Family History  Problem Relation Age of Onset   High blood pressure Mother    High Cholesterol Mother    Depression Mother    Anxiety disorder Mother    Atrial fibrillation Mother    Thyroid disease Mother    High blood pressure Father    Heart disease Father    High Cholesterol Sister    Cancer Sister  Breast cancer Maternal Aunt    Breast cancer Paternal Grandmother     Social History   Socioeconomic History   Marital status: Married    Spouse name: Asher Muir   Number of children: 2   Years of education: Not on file   Highest education level: Associate degree: occupational, Scientist, product/process development, or vocational program  Occupational History   Not on file  Tobacco Use   Smoking status: Never    Passive exposure: Never   Smokeless tobacco: Never  Vaping Use   Vaping status: Never Used  Substance and Sexual Activity   Alcohol use: Yes    Comment: once a month   Drug use: Never   Sexual activity: Yes    Birth control/protection: Surgical  Other Topics Concern   Not on  file  Social History Narrative   Lives at home with husband   Right handed   Caffeine: 4 caffeine drinks a day   Social Drivers of Corporate investment banker Strain: Not on file  Food Insecurity: Not on file  Transportation Needs: Not on file  Physical Activity: Not on file  Stress: Not on file  Social Connections: Unknown (04/12/2022)   [facility]   Social Network    Social Network: Not on file  Intimate Partner Violence: Unknown (03/04/2022)   Received from [facility], [facility]   HITS    Physically Hurt: Not on file    Insult or Talk Down To: Not on file    Threaten Physical Harm: Not on file    Scream or Curse: Not on file    Review of Systems: See HPI, otherwise negative ROS  Physical Exam: Vital signs in last 24 hours: Temp:  [98.3 F (36.8 C)] 98.3 F (36.8 C) (04/08 0820) Pulse Rate:  [72] 72 (04/08 0820) Resp:  [11] 11 (04/08 0820) BP: (133)/(97) 133/97 (04/08 0820) SpO2:  [97 %] 97 % (04/08 0820) Weight:  [83.9 kg] 83.9 kg (04/08 0820)   General:   Alert,  Well-developed, well-nourished, pleasant and cooperative in NAD Head:  Normocephalic and atraumatic. Eyes:  Sclera clear, no icterus.   Conjunctiva pink. Ears:  Normal auditory acuity. Nose:  No deformity, discharge,  or lesions. Msk:  Symmetrical without gross deformities. Normal posture. Extremities:  Without clubbing or edema. Neurologic:  Alert and  oriented x4;  grossly normal neurologically. Skin:  Intact without significant lesions or rashes. Psych:  Alert and cooperative. Normal mood and affect.  Impression/Plan: Anne Kramer is here for a colonoscopy to be performed for colon cancer screening purposes.  The risks of the procedure including infection, bleed, or perforation as well as benefits, limitations, alternatives and imponderables have been reviewed with the patient. Questions have been answered. All parties agreeable.

## 2024-03-05 NOTE — Discharge Instructions (Addendum)
  Colonoscopy Discharge Instructions  Read the instructions outlined below and refer to this sheet in the next few weeks. These discharge instructions provide you with general information on caring for yourself after you leave the hospital. Your doctor may also give you specific instructions. While your treatment has been planned according to the most current medical practices available, unavoidable complications occasionally occur.   ACTIVITY You may resume your regular activity, but move at a slower pace for the next 24 hours.  Take frequent rest periods for the next 24 hours.  Walking will help get rid of the air and reduce the bloated feeling in your belly (abdomen).  No driving for 24 hours (because of the medicine (anesthesia) used during the test).   Do not sign any important legal documents or operate any machinery for 24 hours (because of the anesthesia used during the test).  NUTRITION Drink plenty of fluids.  You may resume your normal diet as instructed by your doctor.  Begin with a light meal and progress to your normal diet. Heavy or fried foods are harder to digest and may make you feel sick to your stomach (nauseated).  Avoid alcoholic beverages for 24 hours or as instructed.  MEDICATIONS You may resume your normal medications unless your doctor tells you otherwise.  WHAT YOU CAN EXPECT TODAY Some feelings of bloating in the abdomen.  Passage of more gas than usual.  Spotting of blood in your stool or on the toilet paper.  IF YOU HAD POLYPS REMOVED DURING THE COLONOSCOPY: No aspirin products for 7 days or as instructed.  No alcohol for 7 days or as instructed.  Eat a soft diet for the next 24 hours.  FINDING OUT THE RESULTS OF YOUR TEST Not all test results are available during your visit. If your test results are not back during the visit, make an appointment with your caregiver to find out the results. Do not assume everything is normal if you have not heard from your  caregiver or the medical facility. It is important for you to follow up on all of your test results.  SEEK IMMEDIATE MEDICAL ATTENTION IF: You have more than a spotting of blood in your stool.  Your belly is swollen (abdominal distention).  You are nauseated or vomiting.  You have a temperature over 101.  You have abdominal pain or discomfort that is severe or gets worse throughout the day.   Your colonoscopy was relatively unremarkable.  I did not find any polyps or evidence of colon cancer.  I recommend repeating colonoscopy in 10 years for colon cancer screening purposes.  You do have diverticulosis and internal hemorrhoids. I would recommend increasing fiber in your diet or adding OTC Benefiber/Metamucil. Be sure to drink at least 4 to 6 glasses of water daily. Follow-up with GI in 4 months.   I hope you have a great rest of your week!  Hennie Duos. Marletta Lor, D.O. Gastroenterology and Hepatology [facility]

## 2024-03-05 NOTE — Transfer of Care (Signed)
 Immediate Anesthesia Transfer of Care Note  Patient: Anne Kramer  Procedure(s) Performed: COLONOSCOPY  Patient Location: PACU and Nursing Unit  Anesthesia Type:MAC  Level of Consciousness: awake and alert   Airway & Oxygen Therapy: Patient Spontanous Breathing and Patient connected to nasal cannula oxygen  Post-op Assessment: Report given to RN and Post -op Vital signs reviewed and stable  Post vital signs: Reviewed and stable  Last Vitals:  Vitals Value Taken Time  BP 109/72 03/05/24 0936  Temp 36.5 C 03/05/24 0936  Pulse 73 03/05/24 0936  Resp 20 03/05/24 0936  SpO2 99 % 03/05/24 0936    Last Pain:  Vitals:   03/05/24 0936  TempSrc: Oral  PainSc: 0-No pain      Patients Stated Pain Goal: 8 (03/05/24 0820)  Complications: No notable events documented.

## 2024-03-05 NOTE — Anesthesia Preprocedure Evaluation (Signed)
 Anesthesia Evaluation  Patient identified by MRN, date of birth, ID band Patient awake    Reviewed: Allergy & Precautions, H&P , NPO status , Patient's Chart, lab work & pertinent test results, reviewed documented beta blocker date and time   History of Anesthesia Complications (+) PONV and history of anesthetic complications  Airway Mallampati: II  TM Distance: >3 FB Neck ROM: full    Dental no notable dental hx.    Pulmonary neg pulmonary ROS   Pulmonary exam normal breath sounds clear to auscultation       Cardiovascular Exercise Tolerance: Good hypertension, negative cardio ROS  Rhythm:regular Rate:Normal     Neuro/Psych  PSYCHIATRIC DISORDERS Anxiety Depression    negative neurological ROS     GI/Hepatic Neg liver ROS, hiatal hernia,GERD  ,,  Endo/Other  diabetesHypothyroidism    Renal/GU negative Renal ROS  negative genitourinary   Musculoskeletal   Abdominal   Peds  Hematology  (+) Blood dyscrasia, anemia   Anesthesia Other Findings   Reproductive/Obstetrics negative OB ROS                             Anesthesia Physical Anesthesia Plan  ASA: 2  Anesthesia Plan: General   Post-op Pain Management:    Induction:   PONV Risk Score and Plan: Propofol infusion  Airway Management Planned:   Additional Equipment:   Intra-op Plan:   Post-operative Plan:   Informed Consent: I have reviewed the patients History and Physical, chart, labs and discussed the procedure including the risks, benefits and alternatives for the proposed anesthesia with the patient or authorized representative who has indicated his/her understanding and acceptance.     Dental Advisory Given  Plan Discussed with: CRNA  Anesthesia Plan Comments:        Anesthesia Quick Evaluation

## 2024-03-06 ENCOUNTER — Encounter (HOSPITAL_COMMUNITY): Payer: Self-pay | Admitting: Internal Medicine

## 2024-03-06 ENCOUNTER — Other Ambulatory Visit: Payer: Self-pay

## 2024-03-06 NOTE — Anesthesia Postprocedure Evaluation (Signed)
 Anesthesia Post Note  Patient: Anne Kramer  Procedure(s) Performed: COLONOSCOPY  Patient location during evaluation: Phase II Anesthesia Type: General Level of consciousness: awake Pain management: pain level controlled Vital Signs Assessment: post-procedure vital signs reviewed and stable Respiratory status: spontaneous breathing and respiratory function stable Cardiovascular status: blood pressure returned to baseline and stable Postop Assessment: no headache and no apparent nausea or vomiting Anesthetic complications: no Comments: Late entry   No notable events documented.   Last Vitals:  Vitals:   03/05/24 0820 03/05/24 0936  BP: (!) 133/97 109/72  Pulse: 72 73  Resp: 11 20  Temp: 36.8 C 36.5 C  SpO2: 97% 99%    Last Pain:  Vitals:   03/05/24 0936  TempSrc: Oral  PainSc: 0-No pain                 Windell Norfolk

## 2024-03-13 ENCOUNTER — Other Ambulatory Visit (HOSPITAL_COMMUNITY): Payer: Self-pay

## 2024-05-03 ENCOUNTER — Other Ambulatory Visit (HOSPITAL_COMMUNITY): Payer: Self-pay | Admitting: Family Medicine

## 2024-05-03 DIAGNOSIS — Z1231 Encounter for screening mammogram for malignant neoplasm of breast: Secondary | ICD-10-CM

## 2024-05-09 ENCOUNTER — Ambulatory Visit (HOSPITAL_COMMUNITY)
Admission: RE | Admit: 2024-05-09 | Discharge: 2024-05-09 | Disposition: A | Discharge status: Home / Self Care | Source: Ambulatory Visit

## 2024-05-09 ENCOUNTER — Other Ambulatory Visit (HOSPITAL_COMMUNITY): Payer: Self-pay | Admitting: Family Medicine

## 2024-05-09 ENCOUNTER — Other Ambulatory Visit (HOSPITAL_BASED_OUTPATIENT_CLINIC_OR_DEPARTMENT_OTHER): Payer: Self-pay | Admitting: *Deleted

## 2024-05-09 DIAGNOSIS — Z0000 Encounter for general adult medical examination without abnormal findings: Secondary | ICD-10-CM

## 2024-05-09 DIAGNOSIS — Z1231 Encounter for screening mammogram for malignant neoplasm of breast: Secondary | ICD-10-CM | POA: Diagnosis not present

## 2024-05-10 ENCOUNTER — Ambulatory Visit
Admission: EM | Admit: 2024-05-10 | Discharge: 2024-05-10 | Disposition: A | Discharge status: Home / Self Care | Attending: Family Medicine | Admitting: Family Medicine

## 2024-05-10 ENCOUNTER — Other Ambulatory Visit: Payer: Self-pay

## 2024-05-10 ENCOUNTER — Other Ambulatory Visit (HOSPITAL_COMMUNITY): Payer: Self-pay

## 2024-05-10 ENCOUNTER — Encounter (HOSPITAL_BASED_OUTPATIENT_CLINIC_OR_DEPARTMENT_OTHER): Payer: Self-pay

## 2024-05-10 ENCOUNTER — Other Ambulatory Visit (HOSPITAL_BASED_OUTPATIENT_CLINIC_OR_DEPARTMENT_OTHER): Payer: Commercial Managed Care - PPO

## 2024-05-10 DIAGNOSIS — W540XXA Bitten by dog, initial encounter: Secondary | ICD-10-CM | POA: Diagnosis not present

## 2024-05-10 DIAGNOSIS — S61412A Laceration without foreign body of left hand, initial encounter: Secondary | ICD-10-CM | POA: Diagnosis not present

## 2024-05-10 DIAGNOSIS — S91312A Laceration without foreign body, left foot, initial encounter: Secondary | ICD-10-CM

## 2024-05-10 LAB — COMPREHENSIVE METABOLIC PANEL WITH GFR
ALT: 17 IU/L (ref 0–32)
AST: 13 IU/L (ref 0–40)
Albumin: 4.3 g/dL (ref 3.8–4.9)
Alkaline Phosphatase: 122 IU/L — ABNORMAL HIGH (ref 44–121)
BUN/Creatinine Ratio: 17 (ref 9–23)
BUN: 16 mg/dL (ref 6–24)
Bilirubin Total: 0.5 mg/dL (ref 0.0–1.2)
CO2: 16 mmol/L — ABNORMAL LOW (ref 20–29)
Calcium: 10 mg/dL (ref 8.7–10.2)
Chloride: 104 mmol/L (ref 96–106)
Creatinine, Ser: 0.94 mg/dL (ref 0.57–1.00)
Globulin, Total: 2.6 g/dL (ref 1.5–4.5)
Glucose: 136 mg/dL — ABNORMAL HIGH (ref 70–99)
Potassium: 4.3 mmol/L (ref 3.5–5.2)
Sodium: 138 mmol/L (ref 134–144)
Total Protein: 6.9 g/dL (ref 6.0–8.5)
eGFR: 73 mL/min/{1.73_m2} (ref >59)

## 2024-05-10 LAB — CBC WITH DIFFERENTIAL/PLATELET
Basophils Absolute: 0.1 10*3/uL (ref 0.0–0.2)
Basos: 1 %
EOS (ABSOLUTE): 0.2 10*3/uL (ref 0.0–0.4)
Eos: 3 %
Hematocrit: 39.8 % (ref 34.0–46.6)
Hemoglobin: 12.8 g/dL (ref 11.1–15.9)
Immature Grans (Abs): 0 10*3/uL (ref 0.0–0.1)
Immature Granulocytes: 0 %
Lymphocytes Absolute: 1.8 10*3/uL (ref 0.7–3.1)
Lymphs: 29 %
MCH: 28.4 pg (ref 26.6–33.0)
MCHC: 32.2 g/dL (ref 31.5–35.7)
MCV: 88 fL (ref 79–97)
Monocytes Absolute: 0.2 10*3/uL (ref 0.1–0.9)
Monocytes: 4 %
Neutrophils Absolute: 3.9 10*3/uL (ref 1.4–7.0)
Neutrophils: 63 %
Platelets: 351 10*3/uL (ref 150–450)
RBC: 4.51 x10E6/uL (ref 3.77–5.28)
RDW: 12.9 % (ref 11.7–15.4)
WBC: 6.2 10*3/uL (ref 3.4–10.8)

## 2024-05-10 LAB — HEMOGLOBIN A1C
Est. average glucose Bld gHb Est-mCnc: 111 mg/dL
Hgb A1c MFr Bld: 5.5 % (ref 4.8–5.6)

## 2024-05-10 LAB — TSH: TSH: 0.101 u[IU]/mL — ABNORMAL LOW (ref 0.450–4.500)

## 2024-05-10 LAB — LIPID PANEL
Chol/HDL Ratio: 3.1 ratio (ref 0.0–4.4)
Cholesterol, Total: 192 mg/dL (ref 100–199)
HDL: 61 mg/dL (ref >39)
LDL Chol Calc (NIH): 109 mg/dL — ABNORMAL HIGH (ref 0–99)
Triglycerides: 122 mg/dL (ref 0–149)
VLDL Cholesterol Cal: 22 mg/dL (ref 5–40)

## 2024-05-10 MED ORDER — AMOXICILLIN-POT CLAVULANATE 875-125 MG PO TABS: 1.0000 | ORAL_TABLET | Freq: Two times a day (BID) | ORAL | 0 refills | Status: DC

## 2024-05-10 MED ORDER — CHLORHEXIDINE GLUCONATE 4 % EX SOLN: Freq: Every day | CUTANEOUS | 0 refills | Status: AC | PRN

## 2024-05-10 MED ORDER — MUPIROCIN 2 % EX OINT: 1.0000 | TOPICAL_OINTMENT | Freq: Two times a day (BID) | CUTANEOUS | 0 refills | Status: AC

## 2024-05-10 NOTE — ED Triage Notes (Addendum)
 Dog bite to left hand and left foot.  Was trying to separate dogs this morning.  Bleeding controlled.  Left hand has 2 bites and left foot had bite on heel and lateral side of foot.  Dogs up to date on vaccines.

## 2024-05-10 NOTE — ED Triage Notes (Signed)
Ptr

## 2024-05-10 NOTE — ED Provider Notes (Signed)
 [facility]    CSN: [id number] Arrival date & time: 05/10/24  0903      History   Chief Complaint No chief complaint on file.   HPI Anne Kramer is a 53 y.o. female.   Patient presenting today with multiple lacerations to the left hand and left foot after trying to break up her dogs fighting this morning.  She denies loss of range of motion, uncontrolled bleeding, numbness, tingling, weakness, current use of anticoagulation.  Last tetanus shot was in 2021.  Dogs both up-to-date on rabies vaccines.  So far has not tried anything over-the-counter for symptoms.    Past Medical History:  Diagnosis Date   Abnormal mammogram    Abnormal Pap smear of cervix    Anemia    hx after hysterectomy -no issues now   Anxiety    Cancer (HCC) 2023   thyroid    Depression    Diabetes mellitus without complication (HCC)    GERD (gastroesophageal reflux disease)    History of hiatal hernia    Hyperlipidemia    PONV (postoperative nausea and vomiting)    Restless leg syndrome     Patient Active Problem List   Diagnosis Date Noted   Throat pain 03/26/2023   Postoperative hypothyroidism 09/29/2022   Papillary thyroid  carcinoma (HCC) 09/07/2022   Thyromegaly 04/18/2022   Non-restorative sleep 12/06/2021   Class 2 obesity due to excess calories without serious comorbidity with body mass index (BMI) of 36.0 to 36.9 in adult 12/06/2021   Snoring 12/06/2021   Nocturia more than twice per night 12/06/2021   Pre-diabetes 05/25/2021   RLS (restless legs syndrome) 05/06/2021   Wellness examination 05/06/2021   Anxiety and depression 03/12/2020   BMI 38.0-38.9,adult 03/12/2020   Gastroesophageal reflux disease without esophagitis 03/12/2020   Other hyperlipidemia 03/12/2020    Past Surgical History:  Procedure Laterality Date   ABDOMINAL HYSTERECTOMY  2019   CHOLECYSTECTOMY  2000   COLONOSCOPY  2017   COLONOSCOPY N/A 03/05/2024   Procedure: COLONOSCOPY;  Surgeon: Vinetta Greening, DO;  [facility]: [facility];  Service: Endoscopy;  Laterality: N/A;  900am, asa 2   THYROIDECTOMY Bilateral 09/07/2022   Procedure: TOTAL THYROIDECTOMY;  Surgeon: Daleen Dubs, DO;  Location: [facility];  Service: ENT;  Laterality: Bilateral;   UPPER GASTROINTESTINAL ENDOSCOPY  2017    OB History   No obstetric history on file.      Home Medications    Prior to Admission medications   Medication Sig Start Date End Date Taking? Authorizing Provider  amoxicillin -clavulanate (AUGMENTIN ) 875-125 MG tablet Take 1 tablet by mouth every 12 (twelve) hours. 05/10/24  Yes Corbin Dess, PA-C  chlorhexidine  (HIBICLENS ) 4 % external liquid Apply topically daily as needed. 05/10/24  Yes Corbin Dess, PA-C  mupirocin ointment (BACTROBAN) 2 % Apply 1 Application topically 2 (two) times daily. 05/10/24  Yes Corbin Dess, PA-C  atorvastatin  (LIPITOR) 20 MG tablet Take 1 tablet (20 mg total) by mouth daily. 11/17/23   [place], Raymond J, MD  blood glucose meter kit and supplies Dispense based on patient and insurance preference. Use up to four times daily as directed. (FOR ICD-10 R73.03, Z68.38) 05/25/21   Early, Sara E, NP  buPROPion  (WELLBUTRIN ) 100 MG tablet Take 1 tablet (100 mg total) by mouth daily. 11/17/23   [place], Raymond J, MD  esomeprazole  (NEXIUM ) 40 MG capsule Take 1 capsule (40 mg total) by mouth daily. 08/11/23   [place],  Alonza Jansky, MD  estradiol  (ESTRACE  VAGINAL) 0.1 MG/GM vaginal cream Apply 1/2 inch nightly for 2 weeks followed by twice a week thereafter 05/29/23     gabapentin  (NEURONTIN ) 100 MG capsule Take 1-3 capsules (100-300 mg total) by mouth at bedtime. 11/17/23   [place], Raymond J, MD  levothyroxine  (SYNTHROID ) 112 MCG tablet Take 112 mcg by mouth daily.    [provider]  linaclotide  (LINZESS ) 290 MCG CAPS capsule Take 1 capsule (290 mcg total) by mouth daily before breakfast. 01/05/24 01/04/25  Vinetta Greening, DO  linaclotide   (LINZESS ) 290 MCG CAPS capsule Take 1 capsule (290 mcg total) by mouth daily before breakfast. 01/05/24   Vinetta Greening, DO  Semaglutide ,0.25 or 0.5MG /DOS, (OZEMPIC , 0.25 OR 0.5 MG/DOSE,) 2 MG/3ML SOPN Inject 0.5 mg into the skin once a week. 11/17/23   [place], Alonza Jansky, MD  venlafaxine  XR (EFFEXOR -XR) 150 MG 24 hr capsule Take 1 capsule (150 mg total) by mouth daily with breakfast. 11/17/23   [place], Alonza Jansky, MD    Family History Family History  Problem Relation Age of Onset   High blood pressure Mother    High Cholesterol Mother    Depression Mother    Anxiety disorder Mother    Atrial fibrillation Mother    Thyroid  disease Mother    High blood pressure Father    Heart disease Father    High Cholesterol Sister    Cancer Sister    Breast cancer Maternal Aunt    Breast cancer Paternal Grandmother     Social History Social History   Tobacco Use   Smoking status: Never    Passive exposure: Never   Smokeless tobacco: Never  Vaping Use   Vaping status: Never Used  Substance Use Topics   Alcohol use: Yes    Comment: once a month   Drug use: Never     Allergies   Kiwi extract   Review of Systems Review of Systems Per HPI  Physical Exam Triage Vital Signs ED Triage Vitals [05/10/24 0921]  Encounter Vitals Group     BP (!) 135/94     Girls Systolic BP Percentile      Girls Diastolic BP Percentile      Boys Systolic BP Percentile      Boys Diastolic BP Percentile      Pulse Rate 79     Resp 18     Temp 97.7 F (36.5 C)     Temp Source Oral     SpO2 98 %     Weight      Height      Head Circumference      Peak Flow      Pain Score 3     Pain Loc      Pain Education      Exclude from Growth Chart    No data found.  Updated Vital Signs BP (!) 135/94 (BP Location: Right Arm)   Pulse 79   Temp 97.7 F (36.5 C) (Oral)   Resp 18   SpO2 98%   Visual Acuity Right Eye Distance:   Left Eye Distance:   Bilateral Distance:    Right Eye Near:    Left Eye Near:    Bilateral Near:     Physical Exam Vitals and nursing note reviewed.  Constitutional:      Appearance: Normal appearance. She is not ill-appearing.  HENT:     Head: Atraumatic.   Eyes:  Extraocular Movements: Extraocular movements intact.     Conjunctiva/sclera: Conjunctivae normal.    Cardiovascular:     Rate and Rhythm: Normal rate and regular rhythm.     Heart sounds: Normal heart sounds.  Pulmonary:     Effort: Pulmonary effort is normal.     Breath sounds: Normal breath sounds.   Musculoskeletal:        General: Normal range of motion.     Cervical back: Normal range of motion and neck supple.   Skin:    General: Skin is warm.     Comments: Multiple fairly well-approximated lacerations to the left hand, lateral left foot and plantar surface of the left heel.  Bleeding well-controlled after direct pressure applied and wounds cleaned.  No foreign body appreciable in wounds   Neurological:     Mental Status: She is alert and oriented to person, place, and time.     Motor: No weakness.     Gait: Gait normal.     Comments: All 4 extremities neurovascularly intact  Psychiatric:        Mood and Affect: Mood normal.        Thought Content: Thought content normal.        Judgment: Judgment normal.      UC Treatments / Results  Labs (all labs ordered are listed, but only abnormal results are displayed) Labs Reviewed - No data to display  EKG   Radiology No results found.  Procedures Procedures (including critical care time)  Medications Ordered in UC Medications - No data to display  Initial Impression / Assessment and Plan / UC Course  I have reviewed the triage vital signs and the nursing notes.  Pertinent labs & imaging results that were available during my care of the patient were reviewed by me and considered in my medical decision making (see chart for details).     Wounds cleaned thoroughly, dressed and prescription sent for  Augmentin , Hibiclens , mupirocin.  Home wound care reviewed.  Return precautions reviewed at length.  Tdap up-to-date.  Final Clinical Impressions(s) / UC Diagnoses   Final diagnoses:  Laceration of left hand without foreign body, initial encounter  Foot laceration, left, initial encounter  Dog bite, initial encounter   Discharge Instructions   None    ED Prescriptions     Medication Sig Dispense Auth. Provider   amoxicillin -clavulanate (AUGMENTIN ) 875-125 MG tablet Take 1 tablet by mouth every 12 (twelve) hours. 14 tablet Tacy Expose [place], PA-C   chlorhexidine  (HIBICLENS ) 4 % external liquid Apply topically daily as needed. 236 mL Corbin Dess, PA-C   mupirocin ointment (BACTROBAN) 2 % Apply 1 Application topically 2 (two) times daily. 60 g Corbin Dess, [place]      PDMP not reviewed this encounter.   Corbin Dess, [place] 05/10/24 1728

## 2024-05-14 ENCOUNTER — Ambulatory Visit (HOSPITAL_BASED_OUTPATIENT_CLINIC_OR_DEPARTMENT_OTHER): Payer: Self-pay | Admitting: Family Medicine

## 2024-05-17 ENCOUNTER — Ambulatory Visit (INDEPENDENT_AMBULATORY_CARE_PROVIDER_SITE_OTHER): Payer: Commercial Managed Care - PPO | Admitting: Family Medicine

## 2024-05-17 ENCOUNTER — Encounter (HOSPITAL_BASED_OUTPATIENT_CLINIC_OR_DEPARTMENT_OTHER): Payer: Self-pay | Admitting: Family Medicine

## 2024-05-17 VITALS — BP 122/84 | HR 81 | Ht 65.0 in | Wt 186.5 lb

## 2024-05-17 DIAGNOSIS — Z0000 Encounter for general adult medical examination without abnormal findings: Secondary | ICD-10-CM | POA: Diagnosis not present

## 2024-05-17 NOTE — Assessment & Plan Note (Signed)
 Routine HCM labs reviewed. HCM reviewed/discussed. Anticipatory guidance regarding healthy weight, lifestyle and choices given. Recommend healthy diet.  Recommend approximately 150 minutes/week of moderate intensity exercise Recommend regular dental and vision exams Always use seatbelt/lap and shoulder restraints Recommend using smoke alarms and checking batteries at least twice a year Recommend using sunscreen when outside Discussed colon cancer screening recommendations, options.  She is UTD Discussed recommendations for shingles vaccine. Discussed immunization recommendations

## 2024-05-17 NOTE — Progress Notes (Signed)
 Subjective:    CC: Annual Physical Exam  HPI:  Anne Kramer is a 53 y.o. presenting for annual physical  I reviewed the past medical history, family history, social history, surgical history, and allergies today and no changes were needed.  Please see the problem list section below in epic for further details.  Past Medical History: Past Medical History:  Diagnosis Date   Abnormal mammogram    Abnormal Pap smear of cervix    Anemia    hx after hysterectomy -no issues now   Anxiety    Cancer (HCC) 2023   thyroid    Depression    Diabetes mellitus without complication (HCC)    GERD (gastroesophageal reflux disease)    History of hiatal hernia    Hyperlipidemia    PONV (postoperative nausea and vomiting)    Restless leg syndrome    Past Surgical History: Past Surgical History:  Procedure Laterality Date   ABDOMINAL HYSTERECTOMY  2019   CHOLECYSTECTOMY  2000   COLONOSCOPY  2017   COLONOSCOPY N/A 03/05/2024   Procedure: COLONOSCOPY;  Surgeon: Vinetta Greening, DO;  [facility]: [facility];  Service: Endoscopy;  Laterality: N/A;  900am, asa 2   THYROIDECTOMY Bilateral 09/07/2022   Procedure: TOTAL THYROIDECTOMY;  Surgeon: Daleen Dubs, DO;  Location: [facility];  Service: ENT;  Laterality: Bilateral;   UPPER GASTROINTESTINAL ENDOSCOPY  2017   Social History: Social History   Socioeconomic History   Marital status: Married    Spouse name: Psychologist, occupational   Number of children: 2   Years of education: Not on file   Highest education level: Associate degree: occupational, Scientist, product/process development, or vocational program  Occupational History   Not on file  Tobacco Use   Smoking status: Never    Passive exposure: Never   Smokeless tobacco: Never  Vaping Use   Vaping status: Never Used  Substance and Sexual Activity   Alcohol use: Yes    Comment: once a month   Drug use: Never   Sexual activity: Yes    Birth control/protection: Surgical  Other Topics Concern   Not on file  Social  History Narrative   Lives at home with husband   Right handed   Caffeine: 4 caffeine drinks a day   Social Drivers of Corporate investment banker Strain: Low Risk  (05/17/2024)   Overall Financial Resource Strain (CARDIA)    Difficulty of Paying Living Expenses: Not hard at all  Food Insecurity: No Food Insecurity (05/17/2024)   Hunger Vital Sign    Worried About Running Out of Food in the Last Year: Never true    Ran Out of Food in the Last Year: Never true  Transportation Needs: No Transportation Needs (05/17/2024)   PRAPARE - Administrator, Civil Service (Medical): No    Lack of Transportation (Non-Medical): No  Physical Activity: Insufficiently Active (05/17/2024)   Exercise Vital Sign    Days of Exercise per Week: 3 days    Minutes of Exercise per Session: 30 min  Stress: No Stress Concern Present (05/17/2024)   [employer] of Occupational Health - Occupational Stress Questionnaire    Feeling of Stress: Only a little  Social Connections: Moderately Isolated (05/17/2024)   Social Connection and Isolation Panel    Frequency of Communication with Friends and Family: More than three times a week    Frequency of Social Gatherings with Friends and Family: More than three times a week    Attends Religious Services: Never  Active Member of Clubs or Organizations: No    Attends Banker Meetings: Never    Marital Status: Married   Family History: Family History  Problem Relation Age of Onset   High blood pressure Mother    High Cholesterol Mother    Depression Mother    Anxiety disorder Mother    Atrial fibrillation Mother    Thyroid  disease Mother    High blood pressure Father    Heart disease Father    High Cholesterol Sister    Cancer Sister    Breast cancer Maternal Aunt    Breast cancer Paternal Grandmother    Allergies: Allergies  Allergen Reactions   Kiwi Extract Swelling    Makes lips swell   Medications: See med rec.  Review of  Systems: No headache, visual changes, nausea, vomiting, diarrhea, constipation, dizziness, abdominal pain, skin rash, fevers, chills, night sweats, swollen lymph nodes, weight loss, chest pain, body aches, joint swelling, muscle aches, shortness of breath, mood changes, visual or auditory hallucinations.  Objective:    BP 122/84 (BP Location: Right Arm, Patient Position: Sitting, Cuff Size: Normal)   Pulse 81   Ht 5' 5 (1.651 m)   Wt 186 lb 8 oz (84.6 kg)   SpO2 97%   BMI 31.04 kg/m   General: Well Developed, well nourished, and in no acute distress.  Neuro: Alert and oriented x3, extra-ocular muscles intact, sensation grossly intact. Cranial nerves II through XII are intact, motor, sensory, and coordinative functions are all intact. HEENT: Normocephalic, atraumatic, pupils equal round reactive to light, neck supple, no masses, no lymphadenopathy, thyroid  nonpalpable. Oropharynx, nasopharynx, external ear canals are unremarkable. Skin: Warm and dry, no rashes noted.  Cardiac: Regular rate and rhythm, no murmurs rubs or gallops.  Respiratory: Clear to auscultation bilaterally. Not using accessory muscles, speaking in full sentences. Abdominal: Soft, nontender, nondistended, positive bowel sounds, no masses, no organomegaly. Musculoskeletal: Shoulder, elbow, wrist, hip, knee, ankle stable, and with full range of motion.  Impression and Recommendations:    Wellness examination Assessment & Plan: Routine HCM labs reviewed. HCM reviewed/discussed. Anticipatory guidance regarding healthy weight, lifestyle and choices given. Recommend healthy diet.  Recommend approximately 150 minutes/week of moderate intensity exercise Recommend regular dental and vision exams Always use seatbelt/lap and shoulder restraints Recommend using smoke alarms and checking batteries at least twice a year Recommend using sunscreen when outside Discussed colon cancer screening recommendations, options.  She is  UTD Discussed recommendations for shingles vaccine. Discussed immunization recommendations   TSH is slightly low.  She does have upcoming labs with endocrinology and expects that they may look into changes in dose of levothyroxine , encouraged to continue with planned follow-up with endocrinology.  Return in about 6 months (around 11/16/2024) for hypertension, diabetes, hyperlipidemia, med check.   ___________________________________________ Ranette Luckadoo de [place], MD, ABFM, CAQSM Primary Care and Sports Medicine [facility]

## 2024-05-17 NOTE — Patient Instructions (Signed)

## 2024-05-26 ENCOUNTER — Ambulatory Visit
Admission: EM | Admit: 2024-05-26 | Discharge: 2024-05-26 | Disposition: A | Discharge status: Home / Self Care | Attending: Family Medicine | Admitting: Family Medicine

## 2024-05-26 ENCOUNTER — Encounter: Payer: Self-pay | Admitting: Emergency Medicine

## 2024-05-26 ENCOUNTER — Other Ambulatory Visit: Payer: Self-pay

## 2024-05-26 DIAGNOSIS — W540XXA Bitten by dog, initial encounter: Secondary | ICD-10-CM | POA: Diagnosis not present

## 2024-05-26 DIAGNOSIS — S81811A Laceration without foreign body, right lower leg, initial encounter: Secondary | ICD-10-CM

## 2024-05-26 MED ORDER — AMOXICILLIN-POT CLAVULANATE 875-125 MG PO TABS
1.0000 | ORAL_TABLET | Freq: Two times a day (BID) | ORAL | 0 refills | Status: AC
Start: 2024-05-26 — End: ?

## 2024-05-26 NOTE — ED Notes (Signed)
 Site cleaned with Hibiclens  and sterile water. Pt tolerated well.  Ointment applied, nonadherent pad applied to sites, wrapped with gauze and secured with coban. Pt tolerated well.  Site management and infection prevention education provided. Pt verbalized understanding.

## 2024-05-26 NOTE — ED Triage Notes (Signed)
 Pt reports dog bite to right lower leg. Pt reports injury occurred while trying to separate dogs last night.   Two large open areas of tissue noted to medial and lateral sides of right lower calf. Bleeding controlled.    Dog up to date on vaccines.

## 2024-05-28 ENCOUNTER — Other Ambulatory Visit (HOSPITAL_BASED_OUTPATIENT_CLINIC_OR_DEPARTMENT_OTHER): Payer: Self-pay | Admitting: Family Medicine

## 2024-05-28 ENCOUNTER — Other Ambulatory Visit: Payer: Self-pay

## 2024-05-28 ENCOUNTER — Other Ambulatory Visit (HOSPITAL_COMMUNITY): Payer: Self-pay

## 2024-05-28 DIAGNOSIS — F419 Anxiety disorder, unspecified: Secondary | ICD-10-CM

## 2024-05-28 MED ORDER — BUPROPION HCL 100 MG PO TABS
100.0000 mg | ORAL_TABLET | Freq: Every day | ORAL | 1 refills | Status: DC
  Filled 2024-05-28: qty 90, 90d supply, fill #0
  Filled 2024-09-07: qty 90, 90d supply, fill #1

## 2024-05-29 NOTE — ED Provider Notes (Signed)
 [facility]    CSN: [id number] Arrival date & time: 05/26/24  0830      History   Chief Complaint Chief Complaint  Patient presents with   Extremity Laceration    HPI Anne Kramer is a 53 y.o. female.   Patient presenting today with 2 lacerations to the right lower leg that occurred when her two dogs fought last night.  She states when she tried to pull them apart one of the dogs grabbed her leg and caused the lacerations.  Denies leg weakness, numbness, tingling, loss of range of motion, uncontrolled bleeding, foreign body.  Clean the areas with Hibiclens  immediately and applied mupirocin  and a dressing.  Patient up-to-date on tetanus and dog up-to-date on rabies vaccine.    Past Medical History:  Diagnosis Date   Abnormal mammogram    Abnormal Pap smear of cervix    Anemia    hx after hysterectomy -no issues now   Anxiety    Cancer (HCC) 2023   thyroid    Depression    Diabetes mellitus without complication (HCC)    GERD (gastroesophageal reflux disease)    History of hiatal hernia    Hyperlipidemia    PONV (postoperative nausea and vomiting)    Restless leg syndrome     Patient Active Problem List   Diagnosis Date Noted   Throat pain 03/26/2023   Postoperative hypothyroidism 09/29/2022   Papillary thyroid  carcinoma (HCC) 09/07/2022   Thyromegaly 04/18/2022   Non-restorative sleep 12/06/2021   Class 2 obesity due to excess calories without serious comorbidity with body mass index (BMI) of 36.0 to 36.9 in adult 12/06/2021   Snoring 12/06/2021   Nocturia more than twice per night 12/06/2021   Pre-diabetes 05/25/2021   RLS (restless legs syndrome) 05/06/2021   Wellness examination 05/06/2021   Anxiety and depression 03/12/2020   BMI 38.0-38.9,adult 03/12/2020   Gastroesophageal reflux disease without esophagitis 03/12/2020   Other hyperlipidemia 03/12/2020    Past Surgical History:  Procedure Laterality Date   ABDOMINAL HYSTERECTOMY  2019    CHOLECYSTECTOMY  2000   COLONOSCOPY  2017   COLONOSCOPY N/A 03/05/2024   Procedure: COLONOSCOPY;  Surgeon: Cindie Carlin POUR, DO;  Location: [facility];  Service: Endoscopy;  Laterality: N/A;  900am, asa 2   THYROIDECTOMY Bilateral 09/07/2022   Procedure: TOTAL THYROIDECTOMY;  Surgeon: Llewellyn Gerard LABOR, DO;  Location: [facility];  Service: ENT;  Laterality: Bilateral;   UPPER GASTROINTESTINAL ENDOSCOPY  2017    OB History   No obstetric history on file.      Home Medications    Prior to Admission medications   Medication Sig Start Date End Date Taking? Authorizing Provider  amoxicillin -clavulanate (AUGMENTIN ) 875-125 MG tablet Take 1 tablet by mouth every 12 (twelve) hours. 05/26/24   Stuart Vernell Norris, PA-C  atorvastatin  (LIPITOR) 20 MG tablet Take 1 tablet (20 mg total) by mouth daily. 11/17/23   [place], Raymond J, MD  buPROPion  (WELLBUTRIN ) 100 MG tablet Take 1 tablet (100 mg total) by mouth daily. 05/28/24   [place], Raymond J, MD  chlorhexidine  (HIBICLENS ) 4 % external liquid Apply topically daily as needed. 05/10/24   Stuart Vernell Norris, PA-C  esomeprazole  (NEXIUM ) 40 MG capsule Take 1 capsule (40 mg total) by mouth daily. 08/11/23   [place], Raymond J, MD  estradiol  (ESTRACE  VAGINAL) 0.1 MG/GM vaginal cream Apply 1/2 inch nightly for 2 weeks followed by twice a week thereafter 05/29/23     gabapentin  (NEURONTIN ) 100  MG capsule Take 1-3 capsules (100-300 mg total) by mouth at bedtime. 11/17/23   [place], Raymond J, MD  levothyroxine  (SYNTHROID ) 112 MCG tablet Take 112 mcg by mouth daily.    [provider]  linaclotide  (LINZESS ) 290 MCG CAPS capsule Take 1 capsule (290 mcg total) by mouth daily before breakfast. 01/05/24 01/04/25  Cindie Carlin POUR, DO  linaclotide  (LINZESS ) 290 MCG CAPS capsule Take 1 capsule (290 mcg total) by mouth daily before breakfast. 01/05/24   Carver, Charles K, DO  mupirocin  ointment (BACTROBAN ) 2 % Apply 1 Application topically 2 (two) times  daily. 05/10/24   Stuart Vernell Norris, PA-C  Semaglutide ,0.25 or 0.5MG /DOS, (OZEMPIC , 0.25 OR 0.5 MG/DOSE,) 2 MG/3ML SOPN Inject 0.5 mg into the skin once a week. 11/17/23   [place], Quintin PARAS, MD  venlafaxine  XR (EFFEXOR -XR) 150 MG 24 hr capsule Take 1 capsule (150 mg total) by mouth daily with breakfast. 11/17/23   [place], Quintin PARAS, MD    Family History Family History  Problem Relation Age of Onset   High blood pressure Mother    High Cholesterol Mother    Depression Mother    Anxiety disorder Mother    Atrial fibrillation Mother    Thyroid  disease Mother    High blood pressure Father    Heart disease Father    High Cholesterol Sister    Cancer Sister    Breast cancer Maternal Aunt    Breast cancer Paternal Grandmother     Social History Social History   Tobacco Use   Smoking status: Never    Passive exposure: Never   Smokeless tobacco: Never  Vaping Use   Vaping status: Never Used  Substance Use Topics   Alcohol use: Yes    Comment: once a month   Drug use: Never     Allergies   Kiwi extract   Review of Systems Review of Systems Per HPI  Physical Exam Triage Vital Signs ED Triage Vitals [05/26/24 0841]  Encounter Vitals Group     BP (!) 123/94     Girls Systolic BP Percentile      Girls Diastolic BP Percentile      Boys Systolic BP Percentile      Boys Diastolic BP Percentile      Pulse Rate 80     Resp 20     Temp 98.7 F (37.1 C)     Temp Source Oral     SpO2 98 %     Weight      Height      Head Circumference      Peak Flow      Pain Score 0     Pain Loc      Pain Education      Exclude from Growth Chart    No data found.  Updated Vital Signs BP (!) 123/94 (BP Location: Right Arm)   Pulse 80   Temp 98.7 F (37.1 C) (Oral)   Resp 20   SpO2 98%   Visual Acuity Right Eye Distance:   Left Eye Distance:   Bilateral Distance:    Right Eye Near:   Left Eye Near:    Bilateral Near:     Physical Exam Vitals and nursing note  reviewed.  Constitutional:      Appearance: Normal appearance. She is not ill-appearing.  HENT:     Head: Atraumatic.     Mouth/Throat:     Mouth: Mucous membranes are moist.  Eyes:  Extraocular Movements: Extraocular movements intact.     Conjunctiva/sclera: Conjunctivae normal.  Cardiovascular:     Rate and Rhythm: Normal rate.  Pulmonary:     Effort: Pulmonary effort is normal.  Musculoskeletal:        General: Normal range of motion.     Cervical back: Normal range of motion and neck supple.  Skin:    General: Skin is warm.     Comments: 2 lacerations that are about 6 to 8 cm and slightly gaping to the right lower anterior leg, bleeding well-controlled.  No foreign body appreciable  Neurological:     Mental Status: She is alert and oriented to person, place, and time.     Comments: Right lower extremity neurovascularly intact  Psychiatric:        Mood and Affect: Mood normal.        Thought Content: Thought content normal.        Judgment: Judgment normal.      UC Treatments / Results  Labs (all labs ordered are listed, but only abnormal results are displayed) Labs Reviewed - No data to display  EKG   Radiology No results found.  Procedures Procedures (including critical care time)  Medications Ordered in UC Medications - No data to display  Initial Impression / Assessment and Plan / UC Course  I have reviewed the triage vital signs and the nursing notes.  Pertinent labs & imaging results that were available during my care of the patient were reviewed by me and considered in my medical decision making (see chart for details).     Lacerations thoroughly cleaned, dressed.  Will forego closure today to prevent infection risk.  Long course of Augmentin  provided, and patient already has Hibiclens  and mupirocin  at home for home wound care.  Reviewed home wound care, return precautions.  Tetanus and dog's rabies up-to-date.  Final Clinical Impressions(s) / UC  Diagnoses   Final diagnoses:  Leg laceration, right, initial encounter  Dog bite, initial encounter   Discharge Instructions   None    ED Prescriptions     Medication Sig Dispense Auth. Provider   amoxicillin -clavulanate (AUGMENTIN ) 875-125 MG tablet Take 1 tablet by mouth every 12 (twelve) hours. 28 tablet Stuart Vernell Norris, [place]      PDMP not reviewed this encounter.   Stuart Vernell Norris, PA-C 05/29/24 1359

## 2024-05-30 ENCOUNTER — Encounter: Payer: Self-pay | Admitting: Internal Medicine

## 2024-06-06 ENCOUNTER — Other Ambulatory Visit (HOSPITAL_COMMUNITY): Payer: Self-pay

## 2024-06-07 ENCOUNTER — Other Ambulatory Visit (HOSPITAL_COMMUNITY): Payer: Self-pay

## 2024-06-11 DIAGNOSIS — C73 Malignant neoplasm of thyroid gland: Secondary | ICD-10-CM | POA: Diagnosis not present

## 2024-07-19 ENCOUNTER — Encounter: Payer: Self-pay | Admitting: Radiology

## 2024-07-19 ENCOUNTER — Ambulatory Visit
Admission: EM | Admit: 2024-07-19 | Discharge: 2024-07-19 | Disposition: A | Discharge status: Home / Self Care | Attending: Family Medicine | Admitting: Family Medicine

## 2024-07-19 DIAGNOSIS — S81811S Laceration without foreign body, right lower leg, sequela: Secondary | ICD-10-CM

## 2024-07-19 DIAGNOSIS — L0100 Impetigo, unspecified: Secondary | ICD-10-CM

## 2024-07-19 MED ORDER — DOXYCYCLINE HYCLATE 100 MG PO CAPS
100.0000 mg | ORAL_CAPSULE | Freq: Two times a day (BID) | ORAL | 0 refills | Status: AC
Start: 2024-07-19 — End: ?

## 2024-07-19 NOTE — ED Triage Notes (Signed)
 Pt reports she has a rash on right lower leg x 1 week.   Cleaned area with hibiclens  solution and applied mupiricin ointment.

## 2024-07-19 NOTE — ED Provider Notes (Signed)
 [facility]    CSN: [id number] Arrival date & time: 07/19/24  9046      History   Chief Complaint No chief complaint on file.   HPI Anne Kramer is a 53 y.o. female.   Patient presenting today with worsening itching, pustules and crusting around 2 healing lacerations to the right lower leg.  Had 2 large lacerations from a dog bite attack a month or so ago to the area that have been healing well and she has been cleaning the area with Hibiclens  and apply mupirocin  ointment and nonstick dressings daily.  These are almost fully healed but the past week has had itching and redness developing in the surrounding area and now has yellow crusting, pustules.  Denies fever, chills, numbness, tingling.    Past Medical History:  Diagnosis Date   Abnormal mammogram    Abnormal Pap smear of cervix    Anemia    hx after hysterectomy -no issues now   Anxiety    Cancer (HCC) 2023   thyroid    Depression    Diabetes mellitus without complication (HCC)    GERD (gastroesophageal reflux disease)    History of hiatal hernia    Hyperlipidemia    PONV (postoperative nausea and vomiting)    Restless leg syndrome     Patient Active Problem List   Diagnosis Date Noted   Throat pain 03/26/2023   Postoperative hypothyroidism 09/29/2022   Papillary thyroid  carcinoma (HCC) 09/07/2022   Thyromegaly 04/18/2022   Non-restorative sleep 12/06/2021   Class 2 obesity due to excess calories without serious comorbidity with body mass index (BMI) of 36.0 to 36.9 in adult 12/06/2021   Snoring 12/06/2021   Nocturia more than twice per night 12/06/2021   Pre-diabetes 05/25/2021   RLS (restless legs syndrome) 05/06/2021   Wellness examination 05/06/2021   Anxiety and depression 03/12/2020   BMI 38.0-38.9,adult 03/12/2020   Gastroesophageal reflux disease without esophagitis 03/12/2020   Other hyperlipidemia 03/12/2020    Past Surgical History:  Procedure Laterality Date   ABDOMINAL  HYSTERECTOMY  2019   CHOLECYSTECTOMY  2000   COLONOSCOPY  2017   COLONOSCOPY N/A 03/05/2024   Procedure: COLONOSCOPY;  Surgeon: Cindie Carlin POUR, DO;  Location: [facility];  Service: Endoscopy;  Laterality: N/A;  900am, asa 2   THYROIDECTOMY Bilateral 09/07/2022   Procedure: TOTAL THYROIDECTOMY;  Surgeon: Llewellyn Gerard LABOR, DO;  Location: [facility];  Service: ENT;  Laterality: Bilateral;   UPPER GASTROINTESTINAL ENDOSCOPY  2017    OB History   No obstetric history on file.      Home Medications    Prior to Admission medications   Medication Sig Start Date End Date Taking? Authorizing Provider  doxycycline  (VIBRAMYCIN ) 100 MG capsule Take 1 capsule (100 mg total) by mouth 2 (two) times daily. 07/19/24  Yes Stuart Vernell Norris, PA-C  amoxicillin -clavulanate (AUGMENTIN ) 875-125 MG tablet Take 1 tablet by mouth every 12 (twelve) hours. 05/26/24   Stuart Vernell Norris, PA-C  atorvastatin  (LIPITOR) 20 MG tablet Take 1 tablet (20 mg total) by mouth daily. 11/17/23   [place], Raymond J, MD  buPROPion  (WELLBUTRIN ) 100 MG tablet Take 1 tablet (100 mg total) by mouth daily. 05/28/24   [place], Raymond J, MD  chlorhexidine  (HIBICLENS ) 4 % external liquid Apply topically daily as needed. 05/10/24   Stuart Vernell Norris, PA-C  esomeprazole  (NEXIUM ) 40 MG capsule Take 1 capsule (40 mg total) by mouth daily. 08/11/23   [place], Raymond J, MD  estradiol  (ESTRACE  VAGINAL) 0.1 MG/GM vaginal cream Apply 1/2 inch nightly for 2 weeks followed by twice a week thereafter 05/29/23     gabapentin  (NEURONTIN ) 100 MG capsule Take 1-3 capsules (100-300 mg total) by mouth at bedtime. 11/17/23   [place], Raymond J, MD  levothyroxine  (SYNTHROID ) 112 MCG tablet Take 112 mcg by mouth daily.    [provider]  linaclotide  (LINZESS ) 290 MCG CAPS capsule Take 1 capsule (290 mcg total) by mouth daily before breakfast. 01/05/24 01/04/25  Cindie Carlin POUR, DO  linaclotide  (LINZESS ) 290 MCG CAPS capsule Take 1 capsule  (290 mcg total) by mouth daily before breakfast. 01/05/24   Carver, Charles K, DO  mupirocin  ointment (BACTROBAN ) 2 % Apply 1 Application topically 2 (two) times daily. 05/10/24   Stuart Vernell Norris, PA-C  Semaglutide ,0.25 or 0.5MG /DOS, (OZEMPIC , 0.25 OR 0.5 MG/DOSE,) 2 MG/3ML SOPN Inject 0.5 mg into the skin once a week. 11/17/23   [place], Quintin PARAS, MD  venlafaxine  XR (EFFEXOR -XR) 150 MG 24 hr capsule Take 1 capsule (150 mg total) by mouth daily with breakfast. 11/17/23   [place], Quintin PARAS, MD    Family History Family History  Problem Relation Age of Onset   High blood pressure Mother    High Cholesterol Mother    Depression Mother    Anxiety disorder Mother    Atrial fibrillation Mother    Thyroid  disease Mother    High blood pressure Father    Heart disease Father    High Cholesterol Sister    Cancer Sister    Breast cancer Maternal Aunt    Breast cancer Paternal Grandmother     Social History Social History   Tobacco Use   Smoking status: Never    Passive exposure: Never   Smokeless tobacco: Never  Vaping Use   Vaping status: Never Used  Substance Use Topics   Alcohol use: Yes    Comment: once a month   Drug use: Never     Allergies   Kiwi extract   Review of Systems Review of Systems Per HPI  Physical Exam Triage Vital Signs ED Triage Vitals  Encounter Vitals Group     BP 07/19/24 1001 127/86     Girls Systolic BP Percentile --      Girls Diastolic BP Percentile --      Boys Systolic BP Percentile --      Boys Diastolic BP Percentile --      Pulse Rate 07/19/24 1001 94     Resp 07/19/24 1001 16     Temp 07/19/24 1001 98.2 F (36.8 C)     Temp Source 07/19/24 1001 Oral     SpO2 07/19/24 1001 96 %     Weight --      Height --      Head Circumference --      Peak Flow --      Pain Score 07/19/24 1000 0     Pain Loc --      Pain Education --      Exclude from Growth Chart --    No data found.  Updated Vital Signs BP 127/86 (BP Location:  Right Arm)   Pulse 94   Temp 98.2 F (36.8 C) (Oral)   Resp 16   SpO2 96%   Visual Acuity Right Eye Distance:   Left Eye Distance:   Bilateral Distance:    Right Eye Near:   Left Eye Near:    Bilateral Near:  Physical Exam Vitals and nursing note reviewed.  Constitutional:      Appearance: Normal appearance. She is not ill-appearing.  HENT:     Head: Atraumatic.  Eyes:     Extraocular Movements: Extraocular movements intact.     Conjunctiva/sclera: Conjunctivae normal.  Cardiovascular:     Rate and Rhythm: Normal rate.  Pulmonary:     Effort: Pulmonary effort is normal.  Musculoskeletal:        General: Normal range of motion.     Cervical back: Normal range of motion and neck supple.  Skin:    General: Skin is warm and dry.     Findings: Erythema and lesion present.     Comments: Healing lacerations to the right lower leg with surrounding raised erythema, yellow crusting and pustules  Neurological:     Mental Status: She is alert and oriented to person, place, and time.     Comments: Right lower extremity neurovascularly intact  Psychiatric:        Mood and Affect: Mood normal.        Thought Content: Thought content normal.        Judgment: Judgment normal.      UC Treatments / Results  Labs (all labs ordered are listed, but only abnormal results are displayed) Labs Reviewed - No data to display  EKG   Radiology No results found.  Procedures Procedures (including critical care time)  Medications Ordered in UC Medications - No data to display  Initial Impression / Assessment and Plan / UC Course  I have reviewed the triage vital signs and the nursing notes.  Pertinent labs & imaging results that were available during my care of the patient were reviewed by me and considered in my medical decision making (see chart for details).     Developing impetigo around the areas of the lacerations.  Treat with doxycycline , continued Hibiclens  and  mupirocin .  Return for worsening symptoms. Final Clinical Impressions(s) / UC Diagnoses   Final diagnoses:  Impetigo  Leg laceration, right, sequela   Discharge Instructions   None    ED Prescriptions     Medication Sig Dispense Auth. Provider   doxycycline  (VIBRAMYCIN ) 100 MG capsule Take 1 capsule (100 mg total) by mouth 2 (two) times daily. 20 capsule Stuart Vernell Norris, [place]      PDMP not reviewed this encounter.   Stuart Vernell Norris, [place] 07/19/24 1103

## 2024-08-03 ENCOUNTER — Other Ambulatory Visit (HOSPITAL_BASED_OUTPATIENT_CLINIC_OR_DEPARTMENT_OTHER): Payer: Self-pay | Admitting: Family Medicine

## 2024-08-03 ENCOUNTER — Other Ambulatory Visit (HOSPITAL_COMMUNITY): Payer: Self-pay

## 2024-08-03 DIAGNOSIS — K219 Gastro-esophageal reflux disease without esophagitis: Secondary | ICD-10-CM

## 2024-08-05 ENCOUNTER — Other Ambulatory Visit (HOSPITAL_COMMUNITY): Payer: Self-pay

## 2024-08-05 ENCOUNTER — Other Ambulatory Visit: Payer: Self-pay

## 2024-08-05 MED ORDER — ESOMEPRAZOLE MAGNESIUM 40 MG PO CPDR
40.0000 mg | DELAYED_RELEASE_CAPSULE | Freq: Every day | ORAL | 3 refills | Status: AC
  Filled 2024-08-05: qty 90, 90d supply, fill #0
  Filled 2024-11-01: qty 90, 90d supply, fill #1

## 2024-08-23 DIAGNOSIS — Z780 Asymptomatic menopausal state: Secondary | ICD-10-CM | POA: Diagnosis not present

## 2024-08-23 DIAGNOSIS — E890 Postprocedural hypothyroidism: Secondary | ICD-10-CM | POA: Diagnosis not present

## 2024-08-23 DIAGNOSIS — C73 Malignant neoplasm of thyroid gland: Secondary | ICD-10-CM | POA: Diagnosis not present

## 2024-08-23 DIAGNOSIS — R232 Flushing: Secondary | ICD-10-CM | POA: Diagnosis not present

## 2024-09-07 ENCOUNTER — Other Ambulatory Visit (HOSPITAL_COMMUNITY): Payer: Self-pay

## 2024-09-24 ENCOUNTER — Other Ambulatory Visit (HOSPITAL_COMMUNITY): Payer: Self-pay

## 2024-09-24 MED ORDER — ESTRADIOL 0.05 MG/24HR TD PTWK
0.0500 mg | MEDICATED_PATCH | TRANSDERMAL | 3 refills | Status: AC
  Filled 2024-09-24: qty 12, 84d supply, fill #0
  Filled 2024-12-14: qty 12, 84d supply, fill #1

## 2024-09-24 MED ORDER — PROGESTERONE MICRONIZED 100 MG PO CAPS
100.0000 mg | ORAL_CAPSULE | Freq: Every day | ORAL | 3 refills | Status: AC
  Filled 2024-09-24: qty 90, 90d supply, fill #0
  Filled 2024-12-14: qty 90, 90d supply, fill #1

## 2024-09-30 ENCOUNTER — Encounter: Payer: Self-pay | Admitting: Radiology

## 2024-10-01 ENCOUNTER — Encounter: Payer: Self-pay | Admitting: Internal Medicine

## 2024-11-01 ENCOUNTER — Other Ambulatory Visit (HOSPITAL_BASED_OUTPATIENT_CLINIC_OR_DEPARTMENT_OTHER): Payer: Self-pay | Admitting: Family Medicine

## 2024-11-01 ENCOUNTER — Other Ambulatory Visit: Payer: Self-pay

## 2024-11-01 DIAGNOSIS — F419 Anxiety disorder, unspecified: Secondary | ICD-10-CM

## 2024-11-01 DIAGNOSIS — G2581 Restless legs syndrome: Secondary | ICD-10-CM

## 2024-11-03 MED ORDER — GABAPENTIN 100 MG PO CAPS
100.0000 mg | ORAL_CAPSULE | Freq: Every day | ORAL | 1 refills | Status: AC
  Filled 2024-11-03: qty 270, 90d supply, fill #0

## 2024-11-04 ENCOUNTER — Other Ambulatory Visit: Payer: Self-pay

## 2024-11-04 ENCOUNTER — Other Ambulatory Visit (HOSPITAL_COMMUNITY): Payer: Self-pay

## 2024-12-02 DIAGNOSIS — E7849 Other hyperlipidemia: Secondary | ICD-10-CM

## 2024-12-02 DIAGNOSIS — F32A Depression, unspecified: Secondary | ICD-10-CM

## 2024-12-02 DIAGNOSIS — E119 Type 2 diabetes mellitus without complications: Secondary | ICD-10-CM

## 2024-12-04 ENCOUNTER — Other Ambulatory Visit (HOSPITAL_BASED_OUTPATIENT_CLINIC_OR_DEPARTMENT_OTHER): Payer: Self-pay | Admitting: Family Medicine

## 2024-12-04 DIAGNOSIS — E7849 Other hyperlipidemia: Secondary | ICD-10-CM

## 2024-12-04 DIAGNOSIS — E119 Type 2 diabetes mellitus without complications: Secondary | ICD-10-CM

## 2024-12-04 DIAGNOSIS — F419 Anxiety disorder, unspecified: Secondary | ICD-10-CM

## 2024-12-09 ENCOUNTER — Encounter (HOSPITAL_BASED_OUTPATIENT_CLINIC_OR_DEPARTMENT_OTHER): Payer: Self-pay | Admitting: Family Medicine

## 2024-12-09 DIAGNOSIS — E7849 Other hyperlipidemia: Secondary | ICD-10-CM

## 2024-12-09 DIAGNOSIS — F419 Anxiety disorder, unspecified: Secondary | ICD-10-CM

## 2024-12-09 MED ORDER — BUPROPION HCL 100 MG PO TABS: 100.0000 mg | ORAL_TABLET | Freq: Every day | ORAL | 0 refills | Status: AC

## 2024-12-09 MED ORDER — VENLAFAXINE HCL ER 150 MG PO CP24: 150.0000 mg | ORAL_CAPSULE | Freq: Every day | ORAL | 0 refills | Status: AC

## 2024-12-09 MED ORDER — ATORVASTATIN CALCIUM 20 MG PO TABS: 20.0000 mg | ORAL_TABLET | Freq: Every day | ORAL | 0 refills | Status: AC

## 2024-12-09 NOTE — Addendum Note (Signed)
 Addended by: RAYANN REXENE HERO on: 12/09/2024 03:00 PM   Modules accepted: Orders

## 2024-12-15 ENCOUNTER — Other Ambulatory Visit (HOSPITAL_COMMUNITY): Payer: Self-pay

## 2024-12-16 ENCOUNTER — Other Ambulatory Visit: Payer: Self-pay

## 2025-01-15 ENCOUNTER — Ambulatory Visit (HOSPITAL_BASED_OUTPATIENT_CLINIC_OR_DEPARTMENT_OTHER): Admitting: Family Medicine

## 2025-05-27 ENCOUNTER — Encounter (HOSPITAL_BASED_OUTPATIENT_CLINIC_OR_DEPARTMENT_OTHER): Admitting: Family Medicine
